# Patient Record
Sex: Female | Born: 1940
Health system: Southern US, Community
[De-identification: ages and names within clinical notes are randomized; demographics above are authoritative.]

## PROBLEM LIST (undated history)

## (undated) DIAGNOSIS — H5440 Blindness, one eye, unspecified eye: Secondary | ICD-10-CM

## (undated) DIAGNOSIS — M3501 Sicca syndrome with keratoconjunctivitis: Secondary | ICD-10-CM

## (undated) HISTORY — DX: Sjogren syndrome with keratoconjunctivitis: M35.01

## (undated) HISTORY — PX: EYE SURGERY: SHX253

---

## 2007-09-17 ENCOUNTER — Ambulatory Visit: Payer: Self-pay | Admitting: Internal Medicine

## 2007-10-01 ENCOUNTER — Ambulatory Visit: Payer: Self-pay | Admitting: Internal Medicine

## 2015-05-17 ENCOUNTER — Encounter: Payer: Self-pay | Admitting: Internal Medicine

## 2017-08-17 ENCOUNTER — Encounter: Payer: Self-pay | Admitting: Internal Medicine

## 2021-02-21 DIAGNOSIS — Z23 Encounter for immunization: Secondary | ICD-10-CM | POA: Diagnosis not present

## 2021-02-21 DIAGNOSIS — H6120 Impacted cerumen, unspecified ear: Secondary | ICD-10-CM | POA: Diagnosis not present

## 2021-02-21 DIAGNOSIS — R0609 Other forms of dyspnea: Secondary | ICD-10-CM | POA: Diagnosis not present

## 2021-03-03 DIAGNOSIS — R0609 Other forms of dyspnea: Secondary | ICD-10-CM | POA: Diagnosis not present

## 2021-03-03 DIAGNOSIS — I083 Combined rheumatic disorders of mitral, aortic and tricuspid valves: Secondary | ICD-10-CM | POA: Diagnosis not present

## 2021-04-12 DIAGNOSIS — J329 Chronic sinusitis, unspecified: Secondary | ICD-10-CM | POA: Diagnosis not present

## 2021-04-12 DIAGNOSIS — R059 Cough, unspecified: Secondary | ICD-10-CM | POA: Diagnosis not present

## 2021-04-27 ENCOUNTER — Encounter: Payer: Self-pay | Admitting: *Deleted

## 2021-04-29 ENCOUNTER — Encounter: Payer: Self-pay | Admitting: Cardiology

## 2021-04-29 ENCOUNTER — Ambulatory Visit: Payer: Medicare HMO | Admitting: Cardiology

## 2021-04-29 VITALS — BP 136/82 | HR 100 | Ht 63.0 in | Wt 127.8 lb

## 2021-04-29 DIAGNOSIS — I5189 Other ill-defined heart diseases: Secondary | ICD-10-CM | POA: Diagnosis not present

## 2021-04-29 DIAGNOSIS — R0602 Shortness of breath: Secondary | ICD-10-CM

## 2021-04-29 NOTE — Progress Notes (Signed)
Cardiology Office Note  Date: 04/29/2021   ID: Sarah Gibbs, DOB 08-Mar-1941, MRN [id number]  PCP:  Richardean Chimera, MD  Cardiologist:  Nona Dell, MD Electrophysiologist:  None   Chief Complaint  Patient presents with   Shortness of Breath    History of Present Illness: Sarah Gibbs is an 81 y.o. female referred for cardiology consultation by Ms. Skillman PA-C at [employer] with history of shortness of breath and mildly abnormal echocardiogram.  I reviewed the available records.  She is here today with her son.  She does describe dyspnea on exertion over the last year, her son has noticed this as well.  She remains active at age 44 however, does her yard work and other chores.  She does not report any wheezing, no exertional chest pain, no cough or hemoptysis.  She states that the symptoms have actually been less recently, NYHA class II.  She was seen at [facility] urgent care in December 2022 with shortness of breath and cough, diagnosis includes sinusitis.  She was treated with Atrovent and azithromycin.  I do not see a chest x-ray.  COVID-19 was negative.  PFTs obtained previously in October 2022 were apparently of poor quality and not able to be interpreted.  She tells me that she was told that she had pneumonia based on a chest x-ray at [facility], I do not have these results.  We went over the results of her echocardiogram done in November 2022 and outlined below.  LVEF is normal at 60 to 65% with only mild diastolic dysfunction, and she otherwise had mild aortic and mitral regurgitation.  None of these findings would necessarily explain her shortness of breath.  She does have a history of Sjogren's syndrome.  No other major medical problems.  I personally reviewed her ECG today which shows sinus rhythm with possible left atrial enlargement.  R' in lead V1 and V2.  We had her ambulate today for 6 minutes with pulse oximeter, oxygen saturation went from 99% down to 94%, no hypoxia.  She  tolerated this well.   Past Medical History:  Diagnosis Date   Sjogren's syndrome with keratoconjunctivitis sicca (HCC)     Past Surgical History:  Procedure Laterality Date   EYE SURGERY      No current outpatient medications on file.   No current facility-administered medications for this visit.   Allergies:  Patient has no allergy information on record.   Social History: The patient  reports that she has quit smoking. Her smoking use included cigarettes. She has never used smokeless tobacco. She reports that she does not drink alcohol and does not use drugs.   Family History: The patient's family history includes Colon cancer in her brother, mother, and sister; Rheum arthritis in her brother.   ROS: No palpitations or syncope.  Hearing loss.  Physical Exam: VS:  BP 136/82 (BP Location: Left Arm, Patient Position: Sitting, Cuff Size: Normal)    Pulse 100    Ht 5\' 3"  (1.6 m)    Wt 127 lb 12.8 oz (58 kg)    SpO2 94% Comment: 1111 after walking 6 minutes   BMI 22.64 kg/m , BMI Body mass index is 22.64 kg/m.  Wt Readings from Last 3 Encounters:  04/29/21 127 lb 12.8 oz (58 kg)    General: Patient appears comfortable at rest. HEENT: Conjunctiva and lids normal, wearing a mask. Neck: Supple, no elevated JVP or carotid bruits, no thyromegaly. Lungs: Clear to auscultation, nonlabored breathing at rest.  Cardiac: Regular rate and rhythm, no S3 or significant systolic murmur, no pericardial rub. Abdomen: Soft, nontender, bowel sounds present. Extremities: No pitting edema, distal pulses 2+. Skin: Warm and dry. Musculoskeletal: No kyphosis. Neuropsychiatric: Alert and oriented x3, affect grossly appropriate.  ECG:   No prior tracings available for review today.  Recent Labwork:  November 2022: BUN 9, creatinine 0.91, potassium 4.2, AST 21, ALT 8, hemoglobin 15.0  Other Studies Reviewed Today:  Echocardiogram 03/03/2021 [facility]): Summary    1. The left ventricle is  normal in size with normal wall thickness.    2. The left ventricular systolic function is normal, LVEF is visually  estimated at 60-65%.    3. There is grade I diastolic dysfunction (impaired relaxation).    4. There is mild mitral valve regurgitation.    5. There is mild aortic regurgitation.    6. The left atrium is mildly dilated in size.    7. The right ventricle is normal in size, with normal systolic function.   Assessment and Plan:  Dyspnea on exertion, intermittent but fairly longstanding over the last year.  Recently symptoms have been less per her report.  Treated in the interim for possible sinusitis/pneumonia, I do not have chest x-ray results for review from [facility].  It sounds like her PFTs were inconclusive.  She does have a history of Sjogren's syndrome, no other pulmonary disease.  She did not show any ambulatory hypoxia today.  Plan to obtain a noncontrast chest CT to exclude any evidence of pulmonary disease that would require further work-up.  Unlikely that her echocardiographic findings are an explanation for her symptoms.   Medication Adjustments/Labs and Tests Ordered: Current medicines are reviewed at length with the patient today.  Concerns regarding medicines are outlined above.   Tests Ordered: Orders Placed This Encounter  Procedures   CT Chest Wo Contrast   EKG 12-Lead    Medication Changes: No orders of the defined types were placed in this encounter.   Disposition:  Follow up  test results.  Signed, Jonelle Sidle, MD, [facility] 04/29/2021 11:16 AM    [facility] at [facility] [address] [place], [place], [place] [address] Phone: [phone]; Fax: [phone]

## 2021-04-29 NOTE — Patient Instructions (Addendum)
Medication Instructions:  Your physician recommends that you continue on your current medications as directed. Please refer to the Current Medication list given to you today.  Labwork: none  Testing/Procedures: Chest CT without contrast  Follow-Up: Your physician recommends that you schedule a follow-up appointment in: pending  Any Other Special Instructions Will Be Listed Below (If Applicable).  If you need a refill on your cardiac medications before your next appointment, please call your pharmacy.

## 2021-05-09 ENCOUNTER — Other Ambulatory Visit: Payer: Self-pay

## 2021-05-09 ENCOUNTER — Ambulatory Visit (HOSPITAL_COMMUNITY)
Admission: RE | Admit: 2021-05-09 | Discharge: 2021-05-09 | Disposition: A | Discharge status: Home / Self Care | Payer: Medicare HMO | Source: Ambulatory Visit | Attending: Cardiology | Admitting: Cardiology

## 2021-05-09 DIAGNOSIS — R918 Other nonspecific abnormal finding of lung field: Secondary | ICD-10-CM | POA: Diagnosis not present

## 2021-05-09 DIAGNOSIS — J439 Emphysema, unspecified: Secondary | ICD-10-CM | POA: Diagnosis not present

## 2021-05-09 DIAGNOSIS — R0602 Shortness of breath: Secondary | ICD-10-CM | POA: Diagnosis not present

## 2021-05-09 DIAGNOSIS — R911 Solitary pulmonary nodule: Secondary | ICD-10-CM | POA: Diagnosis not present

## 2021-05-09 DIAGNOSIS — I700 Atherosclerosis of aorta: Secondary | ICD-10-CM | POA: Diagnosis not present

## 2021-05-18 ENCOUNTER — Telehealth: Payer: Self-pay | Admitting: *Deleted

## 2021-05-18 DIAGNOSIS — R0602 Shortness of breath: Secondary | ICD-10-CM

## 2021-05-18 NOTE — Telephone Encounter (Signed)
-----   Message from Jonelle Sidle, MD sent at 05/09/2021  2:07 PM EST ----- Results reviewed.  Chest CT shows abnormalities consistent with chronic lung disease including fibrotic changes, also a nodule within the right lower lobe that needs further evaluation for potential malignancy.  Emphysematous changes are described as well as coronary artery calcification.  Would please arrange formal Pulmonary consultation in [place] for further evaluation.

## 2021-05-20 NOTE — Telephone Encounter (Signed)
Patient informed and verbalized understanding of plan. Copy sent to PCP 

## 2021-07-04 ENCOUNTER — Other Ambulatory Visit: Payer: Self-pay

## 2021-07-04 ENCOUNTER — Encounter: Payer: Self-pay | Admitting: Internal Medicine

## 2021-07-04 ENCOUNTER — Ambulatory Visit (INDEPENDENT_AMBULATORY_CARE_PROVIDER_SITE_OTHER): Payer: Medicare HMO | Admitting: Internal Medicine

## 2021-07-04 DIAGNOSIS — R911 Solitary pulmonary nodule: Secondary | ICD-10-CM | POA: Diagnosis not present

## 2021-07-04 DIAGNOSIS — J8410 Pulmonary fibrosis, unspecified: Secondary | ICD-10-CM | POA: Diagnosis not present

## 2021-07-04 NOTE — Patient Instructions (Addendum)
Much of what you have in your lung may be related to Sjogren's syndrome  ? ?Please remember to go to the lab department   for your tests - we will call you with the results when they are available. ? ?We will walk you today as fast as you can  ? ?We schedule you for a PET scan next  ?    ? ? ?

## 2021-07-04 NOTE — Assessment & Plan Note (Signed)
Sjogren's around 20117/18 >  No rheum f/u as of 07/04/2021  ?- CT  05/09/21 c/w PF with indeterminate  features  ?- 07/04/2021   Walked on RA x  3  lap(s) =  approx 450  ft  @ fast pace, stopped due to end of study, min sob on last lap with lowest 02 sats 96% @ lowest  ?- Collagen vasc dz labs 07/04/2021 >>> ? ?Most likely this will turn out to be NSIP / collagen vasc dz related and rheum f/u appropriate but biggest concern is ? Also has lung ca > see spn a/p. ? ?For now advised stay as active as possible and monitor ex tol / sats walking mb and back as this is apparently a very long walk and will give early warning if any decline in ex tol ?

## 2021-07-04 NOTE — Progress Notes (Signed)
? ?Sarah Gibbs, female    DOB: 19-Sep-1940,    MRN: [id number] ? ? ?Brief patient profile:  ?80   yowf quit smoking 1991 s resp sequelae  referred to pulmonary clinic in [place]  07/04/2021 by Dr Rocco Pauls NP K Hemberg  for doe x 2021 weed eating.  ? ? ? ? ?History of Present Illness  ?07/04/2021  Pulmonary/ 1st office eval/ [name] / [facility]  ?No chief complaint on file. ?Dyspnea:  weed eating/ long driveway ok  ?Cough: none  ?Sleep: able to lie in bed ?SABA use: none  ?Dx Sjogren's syndrome around 2016 just affected L eye and says never saw rheumatologist, just eye doctor / min dry mouth ?Vax x 2 , never infected  ? ?Rx for pna s corresponding symptoms with zmax p CT suggested this possibility but denies any change whatsoever in symptoms which remain very non-specfiic and mild.  ? ?No obvious day to day or daytime variability or assoc excess/ purulent sputum or mucus plugs or hemoptysis or cp or chest tightness, subjective wheeze or overt sinus or hb symptoms.  ? ?sleeping  without nocturnal  or early am exacerbation  of respiratory  c/o's or need for noct saba. Also denies any obvious fluctuation of symptoms with weather or environmental changes or other aggravating or alleviating factors except as outlined above  ? ?No unusual exposure hx or h/o childhood pna/ asthma or knowledge of premature birth. ? ?Current Allergies, Complete Past Medical History, Past Surgical History, Family History, and Social History were reviewed in [employer] record. ? ?ROS  The following are not active complaints unless bolded ?Hoarseness, sore throat, dysphagia, dental problems, itching, sneezing,  nasal congestion or discharge of excess mucus or purulent secretions, ear ache,   fever, chills, sweats, unintended wt loss or wt gain, classically pleuritic or exertional cp,  orthopnea pnd or arm/hand swelling  or leg swelling, presyncope, palpitations, abdominal pain, anorexia, nausea, vomiting,  diarrhea  or change in bowel habits or change in bladder habits, change in stools or change in urine, dysuria, hematuria,  rash, arthralgias, visual complaints, headache, numbness, weakness or ataxia or problems with walking or coordination,  change in mood or  memory. ?      ?   ? ?Past Medical History:  ?Diagnosis Date  ? Sjogren's syndrome with keratoconjunctivitis sicca (HCC)   ? ? ?No outpatient medications prior to visit.  ? ?No facility-administered medications prior to visit.  ? ? ? ?Objective:  ?  ?  ? ?Wt Readings from Last 3 Encounters:  ?07/04/21 125 lb 3.2 oz (56.8 kg)  ?04/29/21 127 lb 12.8 oz (58 kg)  ?  ?Vital signs reviewed  07/04/2021  - Note at rest 02 sats  99% on RA  ? ?General appearance:    amb wf nad/ minimizes symptoms  ? ? HEENT : pt wearing mask not removed for exam due to covid -19 concerns.  ? ? ?NECK :  without JVD/Nodes/TM/ nl carotid upstrokes bilaterally ? ? ?LUNGS: no acc muscle use,  Nl contour chest which is clear to A and P bilaterally without cough on insp or exp maneuvers ? ? ?CV:  RRR  no s3 or murmur or increase in P2, and no edema  ? ?ABD:  soft and nontender with nl inspiratory excursion in the supine position. No bruits or organomegaly appreciated, bowel sounds nl ? ?MS:  Nl gait/ ext warm without deformities, calf tenderness, cyanosis or clubbing ?No obvious joint restrictions  ? ?  SKIN: warm and dry without lesions   ? ?NEURO:  alert, approp, nl sensorium with  no motor or cerebellar deficits apparent.  ? ? ? ?I personally reviewed images and agree with radiology impression as follows:  ? Chest CT     05/09/21 ?1. There is a spiculated appearing nodular opacity of the dependent ?right lower lobe measuring 2.5 x 2.0 cm.   ?2. Additional ground-glass opacity of the peripheral left upper lobe measuring 1.5 x 1.3 cm and the anterior left upper lobe measuring 0.6 cm. These are nonspecific and less worrisome for malignancy. ?3. Bibasilar predominant pattern of irregular  peripheral ?interstitial opacity and septal thickening without evidence of ?associated subpleural bronchiolectasis or honeycombing. Findings ?suggest mild pulmonary fibrosis in an "indeterminate for UIP" ?pattern, general differential consideration of sequelae of prior ?infection or aspiration. Findings are indeterminate for UIP per ?consensus guidelines: Diagnosis of Idiopathic Pulmonary Fibrosis: An ?Official ATS/ERS/JRS/ALAT Clinical Practice Guideline. Am J Respir ?Crit Care Med Vol 198, Iss 5, [id number], Dec 23 2016. ?4. Emphysema. ?5. Coronary artery disease. ? ?  ? ? ?   ?Assessment  ? ?Postinflammatory pulmonary fibrosis (HCC) ?Sjogren's around 20117/18 >  No rheum f/u as of 07/04/2021  ?- CT  05/09/21 c/w PF with indeterminate  features  ?- 07/04/2021   Walked on RA x  3  lap(s) =  approx 450  ft  @ fast pace, stopped due to end of study, min sob on last lap with lowest 02 sats 96% @ lowest  ?- Collagen vasc dz labs 07/04/2021 >>> ? ?Most likely this will turn out to be NSIP / collagen vasc dz related and rheum f/u appropriate but biggest concern is ? Also has lung ca > see spn a/p. ? ?For now advised stay as active as possible and monitor ex tol / sats walking mb and back as this is apparently a very long walk and will give early warning if any decline in ex tol ? ? ? ?Solitary pulmonary nodule on lung CT ?Quit smoking in 1991 ?- See CT chest  05/09/21  2.5 cm RLL dependent portion spiculated> PET ordered  ? ?Worrisome for lung ca so PET approp given size  ? ?Discussed in detail all the  indications, usual  risks and alternatives  relative to the benefits with patient who agrees to proceed with w/u as outlined.    ? ?Each maintenance medication was reviewed in detail including emphasizing most importantly the difference between maintenance and prns and under what circumstances the prns are to be triggered using an action plan format where appropriate. ? ?Total time for H and P, chart review, counseling,   directly observing portions of ambulatory 02 saturation study/ and generating customized AVS unique to this office visit / same day charting =  45 min ? ?Sandrea Hughs, MD ?07/04/2021 ?   ?

## 2021-07-04 NOTE — Assessment & Plan Note (Signed)
Quit smoking in 1991 ?- See CT chest  05/09/21  2.5 cm RLL dependent portion spiculated> PET ordered  ? ?Worrisome for lung ca so PET approp given size  ? ?Discussed in detail all the  indications, usual  risks and alternatives  relative to the benefits with patient who agrees to proceed with w/u as outlined.    ? ?Each maintenance medication was reviewed in detail including emphasizing most importantly the difference between maintenance and prns and under what circumstances the prns are to be triggered using an action plan format where appropriate. ? ?Total time for H and P, chart review, counseling,  directly observing portions of ambulatory 02 saturation study/ and generating customized AVS unique to this office visit / same day charting =  45 min ?     ?  ?       ?

## 2021-07-06 LAB — CBC WITH DIFFERENTIAL/PLATELET
Basophils Absolute: 0.1 10*3/uL (ref 0.0–0.2)
Basos: 1 %
EOS (ABSOLUTE): 0.1 10*3/uL (ref 0.0–0.4)
Eos: 1 %
Hematocrit: 45.4 % (ref 34.0–46.6)
Hemoglobin: 15.6 g/dL (ref 11.1–15.9)
Immature Grans (Abs): 0 10*3/uL (ref 0.0–0.1)
Immature Granulocytes: 0 %
Lymphocytes Absolute: 1.5 10*3/uL (ref 0.7–3.1)
Lymphs: 28 %
MCH: 31.8 pg (ref 26.6–33.0)
MCHC: 34.4 g/dL (ref 31.5–35.7)
MCV: 93 fL (ref 79–97)
Monocytes Absolute: 0.4 10*3/uL (ref 0.1–0.9)
Monocytes: 8 %
Neutrophils Absolute: 3.4 10*3/uL (ref 1.4–7.0)
Neutrophils: 62 %
Platelets: 291 10*3/uL (ref 150–450)
RBC: 4.91 x10E6/uL (ref 3.77–5.28)
RDW: 12.8 % (ref 11.7–15.4)
WBC: 5.5 10*3/uL (ref 3.4–10.8)

## 2021-07-06 LAB — ANA: Anti Nuclear Antibody (ANA): POSITIVE — AB

## 2021-07-06 LAB — BASIC METABOLIC PANEL
BUN/Creatinine Ratio: 10 — ABNORMAL LOW (ref 12–28)
BUN: 10 mg/dL (ref 8–27)
CO2: 24 mmol/L (ref 20–29)
Calcium: 9.4 mg/dL (ref 8.7–10.3)
Chloride: 102 mmol/L (ref 96–106)
Creatinine, Ser: 1.04 mg/dL — ABNORMAL HIGH (ref 0.57–1.00)
Glucose: 90 mg/dL (ref 70–99)
Potassium: 4.7 mmol/L (ref 3.5–5.2)
Sodium: 135 mmol/L (ref 134–144)
eGFR: 54 mL/min/{1.73_m2} — ABNORMAL LOW (ref >59)

## 2021-07-06 LAB — SEDIMENTATION RATE: Sed Rate: 8 mm/hr (ref 0–40)

## 2021-07-06 LAB — RHEUMATOID FACTOR: Rheumatoid fact SerPl-aCnc: 254.8 IU/mL — ABNORMAL HIGH (ref <14.0)

## 2021-07-06 LAB — ANTI-JO 1 ANTIBODY, IGG: Anti JO-1: <0.2 AI (ref 0.0–0.9)

## 2021-07-06 LAB — CYCLIC CITRUL PEPTIDE ANTIBODY, IGG/IGA: Cyclic Citrullin Peptide Ab: 19 units (ref 0–19)

## 2021-07-21 ENCOUNTER — Ambulatory Visit (HOSPITAL_COMMUNITY)
Admission: RE | Admit: 2021-07-21 | Discharge: 2021-07-21 | Disposition: A | Discharge status: Home / Self Care | Payer: Medicare HMO | Source: Ambulatory Visit | Attending: Internal Medicine | Admitting: Internal Medicine

## 2021-07-21 DIAGNOSIS — R911 Solitary pulmonary nodule: Secondary | ICD-10-CM | POA: Diagnosis not present

## 2021-07-21 DIAGNOSIS — I2510 Atherosclerotic heart disease of native coronary artery without angina pectoris: Secondary | ICD-10-CM | POA: Diagnosis not present

## 2021-07-21 DIAGNOSIS — K5730 Diverticulosis of large intestine without perforation or abscess without bleeding: Secondary | ICD-10-CM | POA: Diagnosis not present

## 2021-07-21 DIAGNOSIS — J439 Emphysema, unspecified: Secondary | ICD-10-CM | POA: Diagnosis not present

## 2021-07-21 DIAGNOSIS — Q630 Accessory kidney: Secondary | ICD-10-CM | POA: Diagnosis not present

## 2021-07-21 MED ORDER — FLUDEOXYGLUCOSE F - 18 (FDG) INJECTION
6.1000 | Freq: Once | INTRAVENOUS | Status: AC | PRN
  Administered 2021-07-21: 6.1 via INTRAVENOUS

## 2021-07-22 ENCOUNTER — Telehealth: Payer: Self-pay | Admitting: Internal Medicine

## 2021-07-22 NOTE — Telephone Encounter (Signed)
Discussed with pt non-specific findings and need to discuss with Dr Regenia Skeeter ?

## 2021-07-22 NOTE — Telephone Encounter (Signed)
Pt calling regarding PET scan results.  Also calling about labs done at visit.  Please advise.  ? ?Dr. [name] please advise it looks like PET was done yesterday and labs were done on 3/13 but no results on labs for patient.  ?Thanks!  ?

## 2021-07-28 ENCOUNTER — Ambulatory Visit (INDEPENDENT_AMBULATORY_CARE_PROVIDER_SITE_OTHER): Payer: Medicare HMO | Admitting: Pulmonary Disease

## 2021-07-28 ENCOUNTER — Encounter: Payer: Self-pay | Admitting: Pulmonary Disease

## 2021-07-28 VITALS — BP 110/62 | HR 96 | Temp 98.0°F | Ht 63.0 in | Wt 123.0 lb

## 2021-07-28 DIAGNOSIS — R918 Other nonspecific abnormal finding of lung field: Secondary | ICD-10-CM

## 2021-07-28 DIAGNOSIS — Z87891 Personal history of nicotine dependence: Secondary | ICD-10-CM | POA: Diagnosis not present

## 2021-07-28 DIAGNOSIS — R942 Abnormal results of pulmonary function studies: Secondary | ICD-10-CM | POA: Diagnosis not present

## 2021-07-28 NOTE — Patient Instructions (Signed)
Thank you for visiting Dr. Valeta Harms at [facility]. ?Today we recommend the following: ? ?Orders Placed This Encounter  ?Procedures  ? CT Super D Chest Wo Contrast  ? ?Follow up appt with Dr. Valeta Harms in 3 months after your ct chest ? ?Return in about 3 months (around 10/27/2021) for w/ Dr. Valeta Harms . ? ? ? ?Please do your part to reduce the spread of COVID-19.  ? ?

## 2021-07-28 NOTE — Progress Notes (Signed)
? ?Synopsis: Referred in April for pulmonary nodules by Richardean Chimera, MD ? ?Subjective:  ? ?PATIENT ID: Sarah Gibbs GENDER: female DOB: 1941/03/02, MRN: [id number] ? ?Chief Complaint  ?Patient presents with  ? Consult  ?  Consult. Patient here to talk about lung nodule.   ? ? ?This is an 81 year old female, past medical history of Sjogren syndrome, arthritis.  Is a former smoker quit in the early 90s however she smoked for nearly 40 years.  From respiratory standpoint she is able to complete all of her activities of daily living.  She had a CT scan of the chest in January and a follow-up PET scan completed by Dr. [name].  This revealed a 2.2 cm right lower lobe hypermetabolic low-level uptake SUV of 3 within the right lower lobe as well as a groundglass opacity within the left.  Talked about the probability of a lesion like this being malignant.  And what neck steps were available.  Of note she does have plans for travel in early June to visit her granddaughter who is graduating college in [place]. ? ? ?Past Medical History:  ?Diagnosis Date  ? Sjogren's syndrome with keratoconjunctivitis sicca (HCC)   ?  ? ?Family History  ?Problem Relation Age of Onset  ? Colon cancer Mother   ? Colon cancer Sister   ? Rheum arthritis Brother   ? Colon cancer Brother   ?  ? ?Past Surgical History:  ?Procedure Laterality Date  ? EYE SURGERY    ? ? ?Social History  ? ?Socioeconomic History  ? Marital status: Married  ?  Spouse name: Not on file  ? Number of children: Not on file  ? Years of education: Not on file  ? Highest education level: Not on file  ?Occupational History  ? Not on file  ?Tobacco Use  ? Smoking status: Former  ?  Types: Cigarettes  ? Smokeless tobacco: Never  ?Substance and Sexual Activity  ? Alcohol use: Never  ? Drug use: Never  ? Sexual activity: Not on file  ?Other Topics Concern  ? Not on file  ?Social History Narrative  ? Not on file  ? ?Social Determinants of Health  ? ?Financial Resource Strain: Not on  file  ?Food Insecurity: Not on file  ?Transportation Needs: Not on file  ?Physical Activity: Not on file  ?Stress: Not on file  ?Social Connections: Not on file  ?Intimate Partner Violence: Not on file  ?  ? ?Not on File  ? ?No outpatient medications prior to visit.  ? ?No facility-administered medications prior to visit.  ? ? ?Review of Systems  ?Constitutional:  Negative for chills, fever, malaise/fatigue and weight loss.  ?HENT:  Negative for hearing loss, sore throat and tinnitus.   ?Eyes:  Negative for blurred vision and double vision.  ?Respiratory:  Positive for shortness of breath. Negative for cough, hemoptysis, sputum production, wheezing and stridor.   ?Cardiovascular:  Negative for chest pain, palpitations, orthopnea, leg swelling and PND.  ?Gastrointestinal:  Negative for abdominal pain, constipation, diarrhea, heartburn, nausea and vomiting.  ?Genitourinary:  Negative for dysuria, hematuria and urgency.  ?Musculoskeletal:  Negative for joint pain and myalgias.  ?Skin:  Negative for itching and rash.  ?Neurological:  Negative for dizziness, tingling, weakness and headaches.  ?Endo/Heme/Allergies:  Negative for environmental allergies. Does not bruise/bleed easily.  ?Psychiatric/Behavioral:  Negative for depression. The patient is not nervous/anxious and does not have insomnia.   ?All other systems reviewed and are negative. ? ? ?  Objective:  ?Physical Exam ?Vitals reviewed.  ?Constitutional:   ?   General: She is not in acute distress. ?   Appearance: She is well-developed.  ?   Comments: Elderly female  ?HENT:  ?   Head: Normocephalic and atraumatic.  ?Eyes:  ?   General: No scleral icterus. ?   Conjunctiva/sclera: Conjunctivae normal.  ?   Pupils: Pupils are equal, round, and reactive to light.  ?Neck:  ?   Vascular: No JVD.  ?   Trachea: No tracheal deviation.  ?Cardiovascular:  ?   Rate and Rhythm: Normal rate and regular rhythm.  ?   Heart sounds: Normal heart sounds. No murmur heard. ?Pulmonary:  ?    Effort: Pulmonary effort is normal. No tachypnea, accessory muscle usage or respiratory distress.  ?   Breath sounds: No stridor. No wheezing, rhonchi or rales.  ?Abdominal:  ?   General: There is no distension.  ?   Palpations: Abdomen is soft.  ?   Tenderness: There is no abdominal tenderness.  ?Musculoskeletal:     ?   General: No tenderness.  ?   Cervical back: Neck supple.  ?Lymphadenopathy:  ?   Cervical: No cervical adenopathy.  ?Skin: ?   General: Skin is warm and dry.  ?   Capillary Refill: Capillary refill takes less than 2 seconds.  ?   Findings: No rash.  ?Neurological:  ?   Mental Status: She is alert and oriented to person, place, and time.  ?Psychiatric:     ?   Behavior: Behavior normal.  ? ? ? ?Vitals:  ? 07/28/21 1533  ?BP: 110/62  ?Pulse: 96  ?Temp: 98 ?F (36.7 ?C)  ?TempSrc: Oral  ?SpO2: 97%  ?Weight: 123 lb (55.8 kg)  ?Height: 5\' 3"  (1.6 m)  ? ?97% on RA ?BMI Readings from Last 3 Encounters:  ?07/28/21 21.79 kg/m?  ?07/04/21 21.49 kg/m?  ?04/29/21 22.64 kg/m?  ? ?Wt Readings from Last 3 Encounters:  ?07/28/21 123 lb (55.8 kg)  ?07/04/21 125 lb 3.2 oz (56.8 kg)  ?04/29/21 127 lb 12.8 oz (58 kg)  ? ? ? ?CBC ?   ?Component Value Date/Time  ? WBC 5.5 07/04/2021 1009  ? RBC 4.91 07/04/2021 1009  ? HGB 15.6 07/04/2021 1009  ? HCT 45.4 07/04/2021 1009  ? PLT 291 07/04/2021 1009  ? MCV 93 07/04/2021 1009  ? MCH 31.8 07/04/2021 1009  ? MCHC 34.4 07/04/2021 1009  ? RDW 12.8 07/04/2021 1009  ? LYMPHSABS 1.5 07/04/2021 1009  ? EOSABS 0.1 07/04/2021 1009  ? BASOSABS 0.1 07/04/2021 1009  ? ? ? ?Chest Imaging: ?Nuclear medicine pet imaging March 2023: ?2.2 cm right lower lobe low-level uptake nodule, SUV of 3.  Groundglass opacity within the left no significant uptake. ?Possible indolent neoplasm. ? ?Pulmonary Functions Testing Results: ?   ? View : No data to display.  ?  ?  ?  ? ? ?FeNO:  ? ?Pathology:  ? ?Echocardiogram:  ? ?Heart Catheterization:  ?   ?Assessment & Plan:  ? ?  ICD-10-CM   ?1. Lung  nodules  R91.8 CT Super D Chest Wo Contrast  ?  ?2. Abnormal PET of right lung  R94.2   ?  ?3. Former smoker  Z87.891   ?  ? ? ?Discussion: ? ?This is an 81 year old female, bilateral lung nodules, abnormal PET scan of the right lung, former smoker. ? ?Plan: ?We discussed all of the risk benefits and alternatives of consideration for biopsy versus  watchful waiting with surveillance imaging. ?Patient opted for 27-month noncontrasted CT imaging in 3 months. ?We will have her follow-up with [place] after the CT is complete. ?Pending upon the behavior of the lung nodule we will consider biopsy. ?If the nodule is still persistent in the right lower lobe I think we need to consider tissue biopsy.  Patient is agreeable to this plan. ?Patient to follow-up with [place] at the end of June/first week of July. ? ? ?No current outpatient medications on file. ? ?I spent 42 minutes dedicated to the care of this patient on the date of this encounter to include pre-visit review of records, face-to-face time with the patient discussing conditions above, post visit ordering of testing, clinical documentation with the electronic health record, making appropriate referrals as documented, and communicating necessary findings to members of the patients care team.  ? ?Josephine Igo, DO ?[facility] ?07/28/2021 4:36 PM   ? ?

## 2021-09-22 ENCOUNTER — Other Ambulatory Visit (HOSPITAL_COMMUNITY): Payer: Medicare HMO

## 2021-10-05 ENCOUNTER — Ambulatory Visit (HOSPITAL_COMMUNITY)
Admission: RE | Admit: 2021-10-05 | Discharge: 2021-10-05 | Disposition: A | Discharge status: Home / Self Care | Payer: Medicare HMO | Source: Ambulatory Visit | Attending: Pulmonary Disease | Admitting: Pulmonary Disease

## 2021-10-05 DIAGNOSIS — R918 Other nonspecific abnormal finding of lung field: Secondary | ICD-10-CM | POA: Diagnosis not present

## 2021-10-05 DIAGNOSIS — J439 Emphysema, unspecified: Secondary | ICD-10-CM | POA: Diagnosis not present

## 2021-10-05 DIAGNOSIS — I700 Atherosclerosis of aorta: Secondary | ICD-10-CM | POA: Diagnosis not present

## 2021-10-23 NOTE — Progress Notes (Signed)
Denise/ Ty,   Can you let the patient know that I have reviewed her CT chest and I think she needs to get in to see Maralyn Sago next week or the following to discuss either repeat ct chest or bronchoscopy? 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   Thanks,  BLI  Josephine Igo, DO [facility] 10/23/2021 1:04 PM

## 2021-10-27 DIAGNOSIS — S30860A Insect bite (nonvenomous) of lower back and pelvis, initial encounter: Secondary | ICD-10-CM | POA: Diagnosis not present

## 2021-10-31 ENCOUNTER — Ambulatory Visit (INDEPENDENT_AMBULATORY_CARE_PROVIDER_SITE_OTHER): Payer: Medicare HMO | Admitting: Pulmonary Disease

## 2021-10-31 ENCOUNTER — Telehealth: Payer: Self-pay | Admitting: Pulmonary Disease

## 2021-10-31 ENCOUNTER — Encounter: Payer: Self-pay | Admitting: Pulmonary Disease

## 2021-10-31 VITALS — BP 118/68 | HR 67 | Temp 97.9°F | Ht 64.0 in | Wt 123.2 lb

## 2021-10-31 DIAGNOSIS — Z87891 Personal history of nicotine dependence: Secondary | ICD-10-CM | POA: Diagnosis not present

## 2021-10-31 DIAGNOSIS — R942 Abnormal results of pulmonary function studies: Secondary | ICD-10-CM | POA: Diagnosis not present

## 2021-10-31 DIAGNOSIS — R918 Other nonspecific abnormal finding of lung field: Secondary | ICD-10-CM | POA: Insufficient documentation

## 2021-10-31 NOTE — Progress Notes (Signed)
Synopsis: Referred in April for pulmonary nodules by Richardean Chimera, MD  Subjective:   PATIENT ID: Sarah Gibbs GENDER: female DOB: 10-01-1940, MRN: [id number]  Chief Complaint  Patient presents with   Follow-up    CT review     This is an 81 year old female, past medical history of Sjogren syndrome, arthritis.  Is a former smoker quit in the early 90s however she smoked for nearly 40 years.  From respiratory standpoint she is able to complete all of her activities of daily living.  She had a CT scan of the chest in January and a follow-up PET scan completed by Dr. [name].  This revealed a 2.2 cm right lower lobe hypermetabolic low-level uptake SUV of 3 within the right lower lobe as well as a groundglass opacity within the left.  Talked about the probability of a lesion like this being malignant.  And what neck steps were available.  Of note she does have plans for travel in early June to visit her granddaughter who is graduating college in [place].  OV 10/31/2021: Here today for follow-up of lung nodules.Patient had repeat CT chest on 10/05/2021: Unchanged spiculated nodular opacity within the right lower lobe 2.5 x 2 cm as well as an unchanged groundglass opacity in the left upper lobe 1.5 x 1.4 cm both concerning for indolent neoplasm.    Past Medical History:  Diagnosis Date   Sjogren's syndrome with keratoconjunctivitis sicca (HCC)      Family History  Problem Relation Age of Onset   Colon cancer Mother    Colon cancer Sister    Rheum arthritis Brother    Colon cancer Brother      Past Surgical History:  Procedure Laterality Date   EYE SURGERY      Social History   Socioeconomic History   Marital status: Married    Spouse name: Not on file   Number of children: Not on file   Years of education: Not on file   Highest education level: Not on file  Occupational History   Not on file  Tobacco Use   Smoking status: Former    Types: Cigarettes   Smokeless tobacco:  Never  Substance and Sexual Activity   Alcohol use: Never   Drug use: Never   Sexual activity: Not on file  Other Topics Concern   Not on file  Social History Narrative   Not on file   Social Determinants of Health   Financial Resource Strain: Not on file  Food Insecurity: Not on file  Transportation Needs: Not on file  Physical Activity: Not on file  Stress: Not on file  Social Connections: Not on file  Intimate Partner Violence: Not on file     Not on File   No outpatient medications prior to visit.   No facility-administered medications prior to visit.    Review of Systems  Constitutional:  Negative for chills, fever, malaise/fatigue and weight loss.  HENT:  Negative for hearing loss, sore throat and tinnitus.   Eyes:  Negative for blurred vision and double vision.  Respiratory:  Negative for cough, hemoptysis, sputum production, shortness of breath, wheezing and stridor.   Cardiovascular:  Negative for chest pain, palpitations, orthopnea, leg swelling and PND.  Gastrointestinal:  Negative for abdominal pain, constipation, diarrhea, heartburn, nausea and vomiting.  Genitourinary:  Negative for dysuria, hematuria and urgency.  Musculoskeletal:  Negative for joint pain and myalgias.  Skin:  Negative for itching and rash.  Neurological:  Negative for  dizziness, tingling, weakness and headaches.  Endo/Heme/Allergies:  Negative for environmental allergies. Does not bruise/bleed easily.  Psychiatric/Behavioral:  Negative for depression. The patient is not nervous/anxious and does not have insomnia.   All other systems reviewed and are negative.    Objective:  Physical Exam Vitals reviewed.  Constitutional:      General: She is not in acute distress.    Appearance: She is well-developed.  HENT:     Head: Normocephalic and atraumatic.  Eyes:     General: No scleral icterus.    Conjunctiva/sclera: Conjunctivae normal.     Pupils: Pupils are equal, round, and reactive  to light.  Neck:     Vascular: No JVD.     Trachea: No tracheal deviation.  Cardiovascular:     Rate and Rhythm: Normal rate and regular rhythm.     Heart sounds: Normal heart sounds. No murmur heard. Pulmonary:     Effort: Pulmonary effort is normal. No tachypnea, accessory muscle usage or respiratory distress.     Breath sounds: No stridor. No wheezing, rhonchi or rales.  Abdominal:     General: Bowel sounds are normal. There is no distension.     Palpations: Abdomen is soft.     Tenderness: There is no abdominal tenderness.  Musculoskeletal:        General: No tenderness.     Cervical back: Neck supple.  Lymphadenopathy:     Cervical: No cervical adenopathy.  Skin:    General: Skin is warm and dry.     Capillary Refill: Capillary refill takes less than 2 seconds.     Findings: No rash.  Neurological:     Mental Status: She is alert and oriented to person, place, and time.  Psychiatric:        Behavior: Behavior normal.      Vitals:   10/31/21 1120  BP: 118/68  Pulse: 67  Temp: 97.9 F (36.6 C)  TempSrc: Oral  SpO2: 99%  Weight: 123 lb 3.2 oz (55.9 kg)  Height: 5\' 4"  (1.626 m)   99% on RA BMI Readings from Last 3 Encounters:  10/31/21 21.15 kg/m  07/28/21 21.79 kg/m  07/04/21 21.49 kg/m   Wt Readings from Last 3 Encounters:  10/31/21 123 lb 3.2 oz (55.9 kg)  07/28/21 123 lb (55.8 kg)  07/04/21 125 lb 3.2 oz (56.8 kg)     CBC    Component Value Date/Time   WBC 5.5 07/04/2021 1009   RBC 4.91 07/04/2021 1009   HGB 15.6 07/04/2021 1009   HCT 45.4 07/04/2021 1009   PLT 291 07/04/2021 1009   MCV 93 07/04/2021 1009   MCH 31.8 07/04/2021 1009   MCHC 34.4 07/04/2021 1009   RDW 12.8 07/04/2021 1009   LYMPHSABS 1.5 07/04/2021 1009   EOSABS 0.1 07/04/2021 1009   BASOSABS 0.1 07/04/2021 1009     Chest Imaging: Nuclear medicine pet imaging March 2023: 2.2 cm right lower lobe low-level uptake nodule, SUV of 3.  Groundglass opacity within the left no  significant uptake. Possible indolent neoplasm.  July 2023 CT chest: Lung nodules persistent, groundglass on the left with no significant change in right lower lobe lesion also no significant change. Both have morphologies concerning for primary malignancy. The patient's images have been independently reviewed by me.    Pulmonary Functions Testing Results:     No data to display          FeNO:   Pathology:   Echocardiogram:   Heart Catheterization:  Assessment & Plan:     ICD-10-CM   1. Lung nodules  R91.8 Procedural/ Surgical Case Request: ROBOTIC ASSISTED NAVIGATIONAL BRONCHOSCOPY    Ambulatory referral to Pulmonology    2. Abnormal PET of right lung  R94.2     3. Former smoker  Z87.891        Discussion:  This is an 81 year old female, bilateral lung nodules, abnormal PET scan of the right lung, former smoker.  Plan: Today in the office we discussed risk benefits and alternatives of continued watchful waiting with surveillance imaging versus consideration for biopsy. The lesions of the chest likely are consistent with indolent neoplasm. I think based on her age and size of the lesion she has a moderate to high risk probability of especially the right lung lesion being a malignancy. We talked about the potential need for radiation treatments following diagnosis of cancer and what that would look like.  They were having some difficulty trying to decide whether or not they even wanted to know what was in the chest or should we just continue with following up images. Ultimately we settled on the decision for tissue biopsy. We will start with the primary lesion on the right side and then consider biopsies of the left lesion as long as the right side goes well. We are okay with this plan we talked about the risk of bleeding and pneumothorax. Orders placed for bronchoscopy on 11/08/2021. We appreciate the PCC's help with scheduling.  I spent 43 minutes dedicated to  the care of this patient on the date of this encounter to include pre-visit review of records, face-to-face time with the patient discussing conditions above, post visit ordering of testing, clinical documentation with the electronic health record, making appropriate referrals as documented, and communicating necessary findings to members of the patients care team.    Josephine Igo, DO [facility] 10/31/2021 12:26 PM

## 2021-10-31 NOTE — Telephone Encounter (Signed)
I had left pt vm to call me for bronch appt info.  I just called her back and had to leave another vm.

## 2021-10-31 NOTE — Telephone Encounter (Signed)
Please advise 

## 2021-10-31 NOTE — Addendum Note (Signed)
Addended by: Dorisann Frames R on: 10/31/2021 01:50 PM   Modules accepted: Orders

## 2021-10-31 NOTE — Patient Instructions (Signed)
Thank you for visiting Dr. Tonia Brooms at [facility]. Today we recommend the following:  Orders Placed This Encounter  Procedures   Procedural/ Surgical Case Request: ROBOTIC ASSISTED NAVIGATIONAL BRONCHOSCOPY   Ambulatory referral to Pulmonology   Bronchoscopy will be on 11/08/2021  Return in about 2 weeks (around 11/14/2021) for with Kandice Robinsons, NP.    Please do your part to reduce the spread of COVID-19.

## 2021-10-31 NOTE — H&P (View-Only) (Signed)
Synopsis: Referred in April for pulmonary nodules by Sarah Chimera, MD  Subjective:   PATIENT ID: Sarah Gibbs GENDER: female DOB: 10-01-1940, MRN: [id number]  Chief Complaint  Patient presents with   Follow-up    CT review     This is an 81 year old female, past medical history of Sjogren syndrome, arthritis.  Is a former smoker quit in the early 90s however she smoked for nearly 40 years.  From respiratory standpoint she is able to complete all of her activities of daily living.  She had a CT scan of the chest in January and a follow-up PET scan completed by Dr. Sherene Gibbs.  This revealed a 2.2 cm right lower lobe hypermetabolic low-level uptake SUV of 3 within the right lower lobe as well as a groundglass opacity within the left.  Talked about the probability of a lesion like this being malignant.  And what neck steps were available.  Of note she does have plans for travel in early June to visit her granddaughter who is graduating college in [place].  OV 10/31/2021: Here today for follow-up of lung nodules.Patient had repeat CT chest on 10/05/2021: Unchanged spiculated nodular opacity within the right lower lobe 2.5 x 2 cm as well as an unchanged groundglass opacity in the left upper lobe 1.5 x 1.4 cm both concerning for indolent neoplasm.    Past Medical History:  Diagnosis Date   Sjogren's syndrome with keratoconjunctivitis sicca (HCC)      Family History  Problem Relation Age of Onset   Colon cancer Mother    Colon cancer Sister    Rheum arthritis Brother    Colon cancer Brother      Past Surgical History:  Procedure Laterality Date   EYE SURGERY      Social History   Socioeconomic History   Marital status: Married    Spouse name: Not on file   Number of children: Not on file   Years of education: Not on file   Highest education level: Not on file  Occupational History   Not on file  Tobacco Use   Smoking status: Former    Types: Cigarettes   Smokeless tobacco:  Never  Substance and Sexual Activity   Alcohol use: Never   Drug use: Never   Sexual activity: Not on file  Other Topics Concern   Not on file  Social History Narrative   Not on file   Social Determinants of Health   Financial Resource Strain: Not on file  Food Insecurity: Not on file  Transportation Needs: Not on file  Physical Activity: Not on file  Stress: Not on file  Social Connections: Not on file  Intimate Partner Violence: Not on file     Not on File   No outpatient medications prior to visit.   No facility-administered medications prior to visit.    Review of Systems  Constitutional:  Negative for chills, fever, malaise/fatigue and weight loss.  HENT:  Negative for hearing loss, sore throat and tinnitus.   Eyes:  Negative for blurred vision and double vision.  Respiratory:  Negative for cough, hemoptysis, sputum production, shortness of breath, wheezing and stridor.   Cardiovascular:  Negative for chest pain, palpitations, orthopnea, leg swelling and PND.  Gastrointestinal:  Negative for abdominal pain, constipation, diarrhea, heartburn, nausea and vomiting.  Genitourinary:  Negative for dysuria, hematuria and urgency.  Musculoskeletal:  Negative for joint pain and myalgias.  Skin:  Negative for itching and rash.  Neurological:  Negative for  dizziness, tingling, weakness and headaches.  Endo/Heme/Allergies:  Negative for environmental allergies. Does not bruise/bleed easily.  Psychiatric/Behavioral:  Negative for depression. The patient is not nervous/anxious and does not have insomnia.   All other systems reviewed and are negative.    Objective:  Physical Exam Vitals reviewed.  Constitutional:      General: She is not in acute distress.    Appearance: She is well-developed.  HENT:     Head: Normocephalic and atraumatic.  Eyes:     General: No scleral icterus.    Conjunctiva/sclera: Conjunctivae normal.     Pupils: Pupils are equal, round, and reactive  to light.  Neck:     Vascular: No JVD.     Trachea: No tracheal deviation.  Cardiovascular:     Rate and Rhythm: Normal rate and regular rhythm.     Heart sounds: Normal heart sounds. No murmur heard. Pulmonary:     Effort: Pulmonary effort is normal. No tachypnea, accessory muscle usage or respiratory distress.     Breath sounds: No stridor. No wheezing, rhonchi or rales.  Abdominal:     General: Bowel sounds are normal. There is no distension.     Palpations: Abdomen is soft.     Tenderness: There is no abdominal tenderness.  Musculoskeletal:        General: No tenderness.     Cervical back: Neck supple.  Lymphadenopathy:     Cervical: No cervical adenopathy.  Skin:    General: Skin is warm and dry.     Capillary Refill: Capillary refill takes less than 2 seconds.     Findings: No rash.  Neurological:     Mental Status: She is alert and oriented to person, place, and time.  Psychiatric:        Behavior: Behavior normal.      Vitals:   10/31/21 1120  BP: 118/68  Pulse: 67  Temp: 97.9 F (36.6 C)  TempSrc: Oral  SpO2: 99%  Weight: 123 lb 3.2 oz (55.9 kg)  Height: 5\' 4"  (1.626 m)   99% on RA BMI Readings from Last 3 Encounters:  10/31/21 21.15 kg/m  07/28/21 21.79 kg/m  07/04/21 21.49 kg/m   Wt Readings from Last 3 Encounters:  10/31/21 123 lb 3.2 oz (55.9 kg)  07/28/21 123 lb (55.8 kg)  07/04/21 125 lb 3.2 oz (56.8 kg)     CBC    Component Value Date/Time   WBC 5.5 07/04/2021 1009   RBC 4.91 07/04/2021 1009   HGB 15.6 07/04/2021 1009   HCT 45.4 07/04/2021 1009   PLT 291 07/04/2021 1009   MCV 93 07/04/2021 1009   MCH 31.8 07/04/2021 1009   MCHC 34.4 07/04/2021 1009   RDW 12.8 07/04/2021 1009   LYMPHSABS 1.5 07/04/2021 1009   EOSABS 0.1 07/04/2021 1009   BASOSABS 0.1 07/04/2021 1009     Chest Imaging: Nuclear medicine pet imaging March 2023: 2.2 cm right lower lobe low-level uptake nodule, SUV of 3.  Groundglass opacity within the left no  significant uptake. Possible indolent neoplasm.  July 2023 CT chest: Lung nodules persistent, groundglass on the left with no significant change in right lower lobe lesion also no significant change. Both have morphologies concerning for primary malignancy. The patient's images have been independently reviewed by me.    Pulmonary Functions Testing Results:     No data to display          FeNO:   Pathology:   Echocardiogram:   Heart Catheterization:  Assessment & Plan:     ICD-10-CM   1. Lung nodules  R91.8 Procedural/ Surgical Case Request: ROBOTIC ASSISTED NAVIGATIONAL BRONCHOSCOPY    Ambulatory referral to Pulmonology    2. Abnormal PET of right lung  R94.2     3. Former smoker  Z87.891        Discussion:  This is an 81 year old female, bilateral lung nodules, abnormal PET scan of the right lung, former smoker.  Plan: Today in the office we discussed risk benefits and alternatives of continued watchful waiting with surveillance imaging versus consideration for biopsy. The lesions of the chest likely are consistent with indolent neoplasm. I think based on her age and size of the lesion she has a moderate to high risk probability of especially the right lung lesion being a malignancy. We talked about the potential need for radiation treatments following diagnosis of cancer and what that would look like.  They were having some difficulty trying to decide whether or not they even wanted to know what was in the chest or should we just continue with following up images. Ultimately we settled on the decision for tissue biopsy. We will start with the primary lesion on the right side and then consider biopsies of the left lesion as long as the right side goes well. We are okay with this plan we talked about the risk of bleeding and pneumothorax. Orders placed for bronchoscopy on 11/08/2021. We appreciate the PCC's help with scheduling.  I spent 43 minutes dedicated to  the care of this patient on the date of this encounter to include pre-visit review of records, face-to-face time with the patient discussing conditions above, post visit ordering of testing, clinical documentation with the electronic health record, making appropriate referrals as documented, and communicating necessary findings to members of the patients care team.    Josephine Igo, DO [facility] 10/31/2021 12:26 PM

## 2021-11-01 NOTE — Telephone Encounter (Signed)
I just called pt & left another vm to call me- also left vm for dtr on dpr to have pt to call me.

## 2021-11-02 NOTE — Telephone Encounter (Signed)
Spoke to pt and gave her appt info.  

## 2021-11-03 NOTE — Progress Notes (Signed)
The patient has an OV with provider.

## 2021-11-04 ENCOUNTER — Other Ambulatory Visit: Payer: Self-pay | Admitting: Pulmonary Disease

## 2021-11-04 LAB — SARS CORONAVIRUS 2 (TAT 6-24 HRS): SARS Coronavirus 2: NEGATIVE

## 2021-11-07 ENCOUNTER — Other Ambulatory Visit: Payer: Self-pay

## 2021-11-07 ENCOUNTER — Telehealth: Payer: Self-pay | Admitting: Internal Medicine

## 2021-11-07 ENCOUNTER — Encounter (HOSPITAL_COMMUNITY): Payer: Self-pay | Admitting: Pulmonary Disease

## 2021-11-07 NOTE — Telephone Encounter (Signed)
Order has been printed, will fax.  

## 2021-11-07 NOTE — Anesthesia Preprocedure Evaluation (Signed)
Anesthesia Evaluation  Patient identified by MRN, date of birth, ID band Patient awake    Reviewed: Allergy & Precautions, NPO status , Patient's Chart, lab work & pertinent test results  Airway Mallampati: I  TM Distance: >3 FB Neck ROM: Full    Dental no notable dental hx. (+) Upper Dentures, Lower Dentures   Pulmonary former smoker,    Pulmonary exam normal breath sounds clear to auscultation       Cardiovascular Exercise Tolerance: Good Normal cardiovascular exam Rhythm:Regular Rate:Normal     Neuro/Psych    GI/Hepatic negative GI ROS, Neg liver ROS,   Endo/Other    Renal/GU Lab Results      Component                Value               Date                      CREATININE               1.04 (H)            07/04/2021                 K                        4.7                 07/04/2021                Musculoskeletal   Abdominal   Peds  Hematology Lab Results      Component                Value               Date                      WBC                      4.5                 11/08/2021                HGB                      14.6                11/08/2021                HCT                      42.8                11/08/2021                MCV                      93.7                11/08/2021                PLT                      174                 11/08/2021  Anesthesia Other Findings Sjorgens syndrome  Blind in L eye  Reproductive/Obstetrics                            Anesthesia Physical Anesthesia Plan  ASA: 2  Anesthesia Plan: General   Post-op Pain Management:    Induction: Intravenous  PONV Risk Score and Plan: Treatment may vary due to age or medical condition  Airway Management Planned: Oral ETT  Additional Equipment:   Intra-op Plan:   Post-operative Plan: Extubation in OR  Informed Consent: I have reviewed the patients History and  Physical, chart, labs and discussed the procedure including the risks, benefits and alternatives for the proposed anesthesia with the patient or authorized representative who has indicated his/her understanding and acceptance.     Dental advisory given  Plan Discussed with:   Anesthesia Plan Comments:        Anesthesia Quick Evaluation

## 2021-11-07 NOTE — Progress Notes (Signed)
Spoke with pt for pre-op call. Pt denies cardiac history, Diabetes or HTN. Pt does have Sjogren syndrome and it causes dry eyes. 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  Covid test done 11/04/21 and it's negative.   Shower instructions given to pt and she voiced understanding.

## 2021-11-08 ENCOUNTER — Ambulatory Visit (HOSPITAL_COMMUNITY): Payer: Medicare HMO

## 2021-11-08 ENCOUNTER — Encounter (HOSPITAL_COMMUNITY)
Admission: RE | Disposition: A | Discharge status: Home / Self Care | Payer: Self-pay | Source: Home / Self Care | Attending: Pulmonary Disease

## 2021-11-08 ENCOUNTER — Ambulatory Visit (HOSPITAL_COMMUNITY): Payer: Medicare HMO | Admitting: Anesthesiology

## 2021-11-08 ENCOUNTER — Encounter (HOSPITAL_COMMUNITY): Payer: Self-pay | Admitting: Pulmonary Disease

## 2021-11-08 ENCOUNTER — Ambulatory Visit (HOSPITAL_COMMUNITY)
Admission: RE | Admit: 2021-11-08 | Discharge: 2021-11-08 | Disposition: A | Discharge status: Home / Self Care | Payer: Medicare HMO | Attending: Pulmonary Disease | Admitting: Pulmonary Disease

## 2021-11-08 ENCOUNTER — Other Ambulatory Visit: Payer: Self-pay

## 2021-11-08 ENCOUNTER — Ambulatory Visit (HOSPITAL_BASED_OUTPATIENT_CLINIC_OR_DEPARTMENT_OTHER): Payer: Medicare HMO | Admitting: Anesthesiology

## 2021-11-08 DIAGNOSIS — R918 Other nonspecific abnormal finding of lung field: Secondary | ICD-10-CM | POA: Insufficient documentation

## 2021-11-08 DIAGNOSIS — I700 Atherosclerosis of aorta: Secondary | ICD-10-CM | POA: Diagnosis not present

## 2021-11-08 DIAGNOSIS — M3500 Sicca syndrome, unspecified: Secondary | ICD-10-CM | POA: Insufficient documentation

## 2021-11-08 DIAGNOSIS — Z87891 Personal history of nicotine dependence: Secondary | ICD-10-CM | POA: Insufficient documentation

## 2021-11-08 DIAGNOSIS — R911 Solitary pulmonary nodule: Secondary | ICD-10-CM

## 2021-11-08 DIAGNOSIS — J984 Other disorders of lung: Secondary | ICD-10-CM | POA: Diagnosis not present

## 2021-11-08 DIAGNOSIS — R0989 Other specified symptoms and signs involving the circulatory and respiratory systems: Secondary | ICD-10-CM | POA: Diagnosis not present

## 2021-11-08 HISTORY — DX: Blindness, one eye, unspecified eye: H54.40

## 2021-11-08 HISTORY — PX: BRONCHIAL NEEDLE ASPIRATION BIOPSY: SHX5106

## 2021-11-08 HISTORY — PX: BRONCHIAL WASHINGS: SHX5105

## 2021-11-08 HISTORY — PX: FIDUCIAL MARKER PLACEMENT: SHX6858

## 2021-11-08 HISTORY — PX: BRONCHIAL BIOPSY: SHX5109

## 2021-11-08 LAB — CBC
HCT: 42.8 % (ref 36.0–46.0)
Hemoglobin: 14.6 g/dL (ref 12.0–15.0)
MCH: 31.9 pg (ref 26.0–34.0)
MCHC: 34.1 g/dL (ref 30.0–36.0)
MCV: 93.7 fL (ref 80.0–100.0)
Platelets: 174 10*3/uL (ref 150–400)
RBC: 4.57 MIL/uL (ref 3.87–5.11)
RDW: 13.1 % (ref 11.5–15.5)
WBC: 4.5 10*3/uL (ref 4.0–10.5)
nRBC: 0 % (ref 0.0–0.2)

## 2021-11-08 SURGERY — BRONCHOSCOPY, WITH BIOPSY USING ELECTROMAGNETIC NAVIGATION
Anesthesia: General | Laterality: Bilateral

## 2021-11-08 MED ORDER — ROCURONIUM BROMIDE 10 MG/ML (PF) SYRINGE
PREFILLED_SYRINGE | INTRAVENOUS | Status: DC | PRN
  Administered 2021-11-08: 50 mg via INTRAVENOUS
  Administered 2021-11-08: 20 mg via INTRAVENOUS
  Administered 2021-11-08: 200 mg via INTRAVENOUS

## 2021-11-08 MED ORDER — LACTATED RINGERS IV SOLN: INTRAVENOUS | Status: DC

## 2021-11-08 MED ORDER — LIDOCAINE 2% (20 MG/ML) 5 ML SYRINGE
INTRAMUSCULAR | Status: DC | PRN
  Administered 2021-11-08: 80 mg via INTRAVENOUS

## 2021-11-08 MED ORDER — PHENYLEPHRINE 80 MCG/ML (10ML) SYRINGE FOR IV PUSH (FOR BLOOD PRESSURE SUPPORT)
PREFILLED_SYRINGE | INTRAVENOUS | Status: DC | PRN
  Administered 2021-11-08 (×2): 160 ug via INTRAVENOUS

## 2021-11-08 MED ORDER — ONDANSETRON HCL 4 MG/2ML IJ SOLN
INTRAMUSCULAR | Status: DC | PRN
  Administered 2021-11-08: 4 mg via INTRAVENOUS

## 2021-11-08 MED ORDER — PROPOFOL 10 MG/ML IV BOLUS
INTRAVENOUS | Status: DC | PRN
  Administered 2021-11-08: 100 mg via INTRAVENOUS

## 2021-11-08 MED ORDER — DEXAMETHASONE SODIUM PHOSPHATE 10 MG/ML IJ SOLN
INTRAMUSCULAR | Status: DC | PRN
  Administered 2021-11-08: 10 mg via INTRAVENOUS

## 2021-11-08 MED ORDER — PHENYLEPHRINE HCL-NACL 20-0.9 MG/250ML-% IV SOLN
INTRAVENOUS | Status: DC | PRN
  Administered 2021-11-08: 45 ug/min via INTRAVENOUS

## 2021-11-08 MED ORDER — FENTANYL CITRATE (PF) 250 MCG/5ML IJ SOLN
INTRAMUSCULAR | Status: DC | PRN
  Administered 2021-11-08 (×2): 50 ug via INTRAVENOUS

## 2021-11-08 MED ORDER — CHLORHEXIDINE GLUCONATE 0.12 % MT SOLN
15.0000 mL | Freq: Once | OROMUCOSAL | Status: AC
  Administered 2021-11-08: 15 mL via OROMUCOSAL
  Filled 2021-11-08 (×2): qty 15

## 2021-11-08 SURGICAL SUPPLY — 1 items: Superlock fiducial marker ×1 IMPLANT

## 2021-11-08 NOTE — Anesthesia Postprocedure Evaluation (Signed)
Anesthesia Post Note  Patient: Sarah Gibbs  Procedure(s) Performed: ROBOTIC ASSISTED NAVIGATIONAL BRONCHOSCOPY (Bilateral) BRONCHIAL BIOPSIES BRONCHIAL NEEDLE ASPIRATION BIOPSIES FIDUCIAL MARKER PLACEMENT BRONCHIAL WASHINGS     Patient location during evaluation: PACU Anesthesia Type: General Level of consciousness: awake and alert Pain management: pain level controlled Vital Signs Assessment: post-procedure vital signs reviewed and stable Respiratory status: spontaneous breathing, nonlabored ventilation, respiratory function stable and patient connected to nasal cannula oxygen Cardiovascular status: blood pressure returned to baseline and stable Postop Assessment: no apparent nausea or vomiting Anesthetic complications: no   No notable events documented.  Last Vitals:  Vitals:   11/08/21 1530 11/08/21 1540  BP: 101/73 98/65  Pulse: 70 71  Resp: 11 10  Temp:    SpO2: 91% 92%    Last Pain:  Vitals:   11/08/21 1540  TempSrc:   PainSc: 0-No pain   Pain Goal:                   Trevor Iha

## 2021-11-08 NOTE — Anesthesia Procedure Notes (Signed)
Procedure Name: Intubation Date/Time: 11/08/2021 1:18 PM  Performed by: Kyung Rudd, CRNAPre-anesthesia Checklist: Patient identified, Emergency Drugs available, Suction available and Patient being monitored Patient Re-evaluated:Patient Re-evaluated prior to induction Oxygen Delivery Method: Circle system utilized Preoxygenation: Pre-oxygenation with 100% oxygen Induction Type: IV induction Ventilation: Mask ventilation without difficulty Laryngoscope Size: Mac and 3 Grade View: Grade I Tube type: Oral Number of attempts: 1 Airway Equipment and Method: Stylet Placement Confirmation: ETT inserted through vocal cords under direct vision, positive ETCO2 and breath sounds checked- equal and bilateral Secured at: 19 cm Tube secured with: Tape Dental Injury: Teeth and Oropharynx as per pre-operative assessment

## 2021-11-08 NOTE — Op Note (Signed)
Video Bronchoscopy with Robotic Assisted Bronchoscopic Navigation   Date of Operation: 11/08/2021   Pre-op Diagnosis: Bilateral pulmonary nodules  Post-op Diagnosis: Bilateral pulmonary nodules  Surgeon: Garner Nash, DO  Assistants: None   Anesthesia: General endotracheal anesthesia  Operation: Flexible video fiberoptic bronchoscopy with robotic assistance and biopsies.  Estimated Blood Loss: Minimal  Complications: None  Indications and History: Sarah Gibbs is a 81 y.o. female with history of bilateral pulmonary nodules. The risks, benefits, complications, treatment options and expected outcomes were discussed with the patient.  The possibilities of pneumothorax, pneumonia, reaction to medication, pulmonary aspiration, perforation of a viscus, bleeding, failure to diagnose a condition and creating a complication requiring transfusion or operation were discussed with the patient who freely signed the consent.    Description of Procedure: The patient was seen in the Preoperative Area, was examined and was deemed appropriate to proceed.  The patient was taken to [facility] endoscopy room 3, identified as Sarah Gibbs and the procedure verified as Flexible Video Fiberoptic Bronchoscopy.  A Time Out was held and the above information confirmed.   Prior to the date of the procedure a high-resolution CT scan of the chest was performed. Utilizing ION software program a virtual tracheobronchial tree was generated to allow the creation of distinct navigation pathways to the patient's parenchymal abnormalities. After being taken to the operating room general anesthesia was initiated and the patient  was orally intubated. The video fiberoptic bronchoscope was introduced via the endotracheal tube and a general inspection was performed which showed normal right and left lung anatomy, aspiration of the bilateral mainstems was completed to remove any remaining secretions. Robotic catheter inserted into  patient's endotracheal tube.   Target #1 right lower lobe: The distinct navigation pathways prepared prior to this procedure were then utilized to navigate to patient's lesion identified on CT scan. The robotic catheter was secured into place and the vision probe was withdrawn.  Lesion location was approximated using fluoroscopy and three-dimensional cone beam CT imaging for peripheral targeting. Under fluoroscopic guidance transbronchial needle brushings, transbronchial needle biopsies, and transbronchial forceps biopsies were performed to be sent for cytology and pathology.  Following tissue sampling a single fiducial was placed in proximity of the lesion using a fiducial catheter wire and delivery kit  Target #2 left upper lobe: The distinct navigation pathways prepared prior to this procedure were then utilized to navigate to patient's lesion identified on CT scan. The robotic catheter was secured into place and the vision probe was withdrawn.  Lesion location was approximated using fluoroscopy and three-dimensional cone beam CT imaging for peripheral targeting. Under fluoroscopic guidance transbronchial forceps biopsies were performed to be sent for cytology and pathology. A bronchioalveolar lavage was performed in the left upper lobe and sent for micro.  At the end of the procedure a general airway inspection was performed and there was no evidence of active bleeding. The bronchoscope was removed.  The patient tolerated the procedure well. There was no significant blood loss and there were no obvious complications. A post-procedural chest x-ray is pending.  Samples Target #1: 1. Transbronchial needle brushings from RLL 2. Transbronchial Wang needle biopsies from RLL 3. Transbronchial forceps biopsies from RLL  Samples Target #2: 1. Transbronchial forceps biopsies from L[facility]  Plans:  The patient will be discharged from the PACU to home when recovered from anesthesia and after chest x-ray is  reviewed. We will review the cytology, pathology and microbiology results with the patient when they become available. Outpatient followup  will be with Garner Nash, DO.  Garner Nash, DO [facility] 11/08/2021 2:47 PM

## 2021-11-08 NOTE — Discharge Instructions (Signed)
Flexible Bronchoscopy, Care After This sheet gives you information about how to care for yourself after your test. Your doctor may also give you more specific instructions. If you have problems or questions, contact your doctor. Follow these instructions at home: Eating and drinking Do not eat or drink anything (not even water) for 2 hours after your test, or until your numbing medicine (local anesthetic) wears off. When your numbness is gone and your cough and gag reflexes have come back, you may: Eat only soft foods. Slowly drink liquids. The day after the test, go back to your normal diet. Driving Do not drive for 24 hours if you were given a medicine to help you relax (sedative). Do not drive or use heavy machinery while taking prescription pain medicine. General instructions  Take over-the-counter and prescription medicines only as told by your doctor. Return to your normal activities as told. Ask what activities are safe for you. Do not use any products that have nicotine or tobacco in them. This includes cigarettes and e-cigarettes. If you need help quitting, ask your doctor. Keep all follow-up visits as told by your doctor. This is important. It is very important if you had a tissue sample (biopsy) taken. Get help right away if: You have shortness of breath that gets worse. You get light-headed. You feel like you are going to pass out (faint). You have chest pain. You cough up: More than a little blood. More blood than before. Summary Do not eat or drink anything (not even water) for 2 hours after your test, or until your numbing medicine wears off. Do not use cigarettes. Do not use e-cigarettes. Get help right away if you have chest pain.  This information is not intended to replace advice given to you by your health care provider. Make sure you discuss any questions you have with your health care provider. Document Released: 02/05/2009 Document Revised: 03/23/2017 Document  Reviewed: 04/28/2016 Elsevier Patient Education  2020 [employer].

## 2021-11-08 NOTE — Transfer of Care (Signed)
Immediate Anesthesia Transfer of Care Note  Patient: Sarah Gibbs  Procedure(s) Performed: ROBOTIC ASSISTED NAVIGATIONAL BRONCHOSCOPY (Bilateral)  Patient Location: Endoscopy Unit  Anesthesia Type:General  Level of Consciousness: drowsy  Airway & Oxygen Therapy: Patient Spontanous Breathing and Patient connected to nasal cannula oxygen  Post-op Assessment: Report given to RN, Post -op Vital signs reviewed and stable and Patient moving all extremities X 4  Post vital signs: Reviewed and stable  Last Vitals:  Vitals Value Taken Time  BP 138/93 11/08/21 1442  Temp    Pulse 83 11/08/21 1445  Resp 14 11/08/21 1445  SpO2 97 % 11/08/21 1445  Vitals shown include unvalidated device data.  Last Pain:  Vitals:   11/08/21 1442  TempSrc:   PainSc: 0-No pain         Complications: No notable events documented.

## 2021-11-08 NOTE — Interval H&P Note (Signed)
History and Physical Interval Note:  11/08/2021 11:11 AM  Sarah Gibbs  has presented today for surgery, with the diagnosis of lung nodules.  The various methods of treatment have been discussed with the patient and family. After consideration of risks, benefits and other options for treatment, the patient has consented to  Procedure(s) with comments: ROBOTIC ASSISTED NAVIGATIONAL BRONCHOSCOPY (Bilateral) - ION w/ CIOS as a surgical intervention.  The patient's history has been reviewed, patient examined, no change in status, stable for surgery.  I have reviewed the patient's chart and labs.  Questions were answered to the patient's satisfaction.     Rachel Bo Braxston Quinter

## 2021-11-09 LAB — CYTOLOGY - NON PAP

## 2021-11-10 LAB — CULTURE, BAL-QUANTITATIVE W GRAM STAIN
Culture: NO GROWTH
Gram Stain: NONE SEEN

## 2021-11-13 LAB — ANAEROBIC CULTURE W GRAM STAIN: Gram Stain: NONE SEEN

## 2021-11-14 ENCOUNTER — Ambulatory Visit (INDEPENDENT_AMBULATORY_CARE_PROVIDER_SITE_OTHER): Payer: Medicare HMO | Admitting: Nurse Practitioner

## 2021-11-14 ENCOUNTER — Encounter: Payer: Self-pay | Admitting: Nurse Practitioner

## 2021-11-14 DIAGNOSIS — J8410 Pulmonary fibrosis, unspecified: Secondary | ICD-10-CM | POA: Diagnosis not present

## 2021-11-14 DIAGNOSIS — R918 Other nonspecific abnormal finding of lung field: Secondary | ICD-10-CM | POA: Diagnosis not present

## 2021-11-14 NOTE — Progress Notes (Signed)
@Patient  ID: Sarah Gibbs, female    DOB: June 03, 1940, 81 y.o.   MRN: [id number]  Chief Complaint  Patient presents with   Follow-up    She had a bronch on 10/31/2021 and she denies any breathing concerns.,     Referring provider: Richardean Chimera, MD  HPI: 81 year old female, former smoker followed for postinflammatory pulmonary fibrosis and lung nodule.  She is a patient of Dr. Thurston Hole and last seen by Dr. Tonia Brooms on 10/31/2021.  Past medical history significant for Sjogren's which she does not see rheumatology for and reports that it is only in her left eye.  She was initially referred to the pulmonary clinic in March 2023 for DOE with weed eating and abnormal CT.  She had a 2.5 cm right lower lobe dependent spiculated nodule, concerning for lung cancer.  Underwent PET scan with hypermetabolism to this area.  She also had some other nodules that had low uptake.  She opted for 3 months noncontrasted CT imaging, which was completed in June and showed unchanged spiculated nodular opacity in the right lower lobe and an unchanged groundglass opacity in the left upper lobe.  She was advised on bronchoscopy versus alternatives of continued watchful waiting with surveillance imaging.  She opted for bronchoscopy which was completed on 11/08/2021.   TEST/EVENTS:  05/09/2021 CT chest without contrast: Atherosclerotic changes.  No LAD is present.  There is mild centrilobular emphysema.  There is bibasilar predominant pattern of irregular peripheral interstitial opacity and septal thickening without evidence of associated subpleural bronchiolectasis or honeycombing, indeterminate for UIP.  There is a spiculated appearing nodular opacity in the right lower lobe measuring 2.5 x 2 cm.  There is also an additional groundglass opacity of the peripheral left upper lobe measuring 1.5 x 1.3 cm.  There is an additional nodule in the left upper lobe measuring 0.6 cm. 07/21/2021 PET scan: Hypermetabolism to the right lower  lobe nodule with SUV max of 3.2.  The 1.3 cm groundglass density nodule in the left upper lobe has SUV max of 1; possibly benign versus low-grade adenocarcinoma.  The other left upper lobe nodule is indeterminate due to small size.  No other evidence of metastatic disease. 10/05/2021 super D chest: Atherosclerosis.  Unchanged mildly prominent mediastinal and hilar lymph nodes.  There is mild centrilobular emphysema which is unchanged.  The previously mentioned nodules are unchanged in size.  There are also some occasional benign calcified pulmonary nodules which are unchanged.  Superimposed bland, bandlike scarring of the bilateral lung bases.  11/14/2021: Today - follow up Patient presents today with her sister to discuss bronchoscopy results. Cytology was negative for malignant cells and cultures are no growth to date. She feels like she recovered well since her procedure. Denies any increased shortness of breath, cough, fevers, or hemoptysis. Regarding her breathing, she doesn't notice much of an impact on her day to day activities. She feels like her breathing used to be worse than it is now. She can go shopping and do everything she wants to without any issues. No concerns or complaints today.   No Known Allergies  Immunization History  Administered Date(s) Administered   Influenza, High Dose Seasonal PF 02/21/2021   Influenza,inj,Quad PF,6+ Mos 02/01/2021   Pneumococcal Conjugate-13 10/20/2016   Tdap 07/26/2018    Past Medical History:  Diagnosis Date   Blind left eye    Sjogren's syndrome with keratoconjunctivitis sicca (HCC)     Tobacco History: Social History   Tobacco Use  Smoking  Status Former   Types: Cigarettes   Quit date: 1991   Years since quitting: 32.5  Smokeless Tobacco Never   Counseling given: Not Answered   Outpatient Medications Prior to Visit  Medication Sig Dispense Refill   Carboxymethylcellulose Sodium (THERATEARS OP) Place 1 drop into both eyes daily as  needed (Dry eyes).     No facility-administered medications prior to visit.     Review of Systems:   Constitutional: No weight loss or gain, night sweats, fevers, chills, fatigue, or lassitude. HEENT: No headaches, difficulty swallowing, tooth/dental problems, or sore throat. No sneezing, itching, ear ache, nasal congestion, or post nasal drip. +dry left eye (chronic), mild dry mouth (chronic) CV:  No chest pain, orthopnea, PND, swelling in lower extremities, anasarca, dizziness, palpitations, syncope Resp: +shortness of breath only with very strenuous activity. No excess mucus or change in color of mucus. No productive or non-productive. No hemoptysis. No wheezing.  No chest wall deformity Skin: No rash, lesions, ulcerations MSK:  No joint pain or swelling.  No decreased range of motion.  No back pain. Neuro: No dizziness or lightheadedness.  Psych: No depression or anxiety. Mood stable.     Physical Exam:  BP 116/70 (BP Location: Left Arm, Cuff Size: Normal)   Pulse 78   Temp 98 F (36.7 C) (Oral)   Ht 5\' 4"  (1.626 m)   Wt 123 lb (55.8 kg)   SpO2 97%   BMI 21.11 kg/m   GEN: Pleasant, interactive, well-appearing; in no acute distress. HEENT:  Normocephalic and atraumatic. PERRLA. Sclera white. Nasal turbinates pink, moist and patent bilaterally. No rhinorrhea present. Oropharynx pink and moist, without exudate or edema. No lesions, ulcerations, or postnasal drip.  NECK:  Supple w/ fair ROM. No JVD present. Normal carotid impulses w/o bruits. Thyroid symmetrical with no goiter or nodules palpated. No lymphadenopathy.   CV: RRR, no m/r/g, no peripheral edema. Pulses intact, +2 bilaterally. No cyanosis, pallor or clubbing. PULMONARY:  Unlabored, regular breathing. Clear bilaterally A&P w/o wheezes/rales/rhonchi. No accessory muscle use. No dullness to percussion. GI: BS present and normoactive. Soft, non-tender to palpation. No organomegaly or masses detected. No CVA  tenderness. MSK: No erythema, warmth or tenderness. Cap refil <2 sec all extrem. No deformities or joint swelling noted.  Neuro: A/Ox3. No focal deficits noted.   Skin: Warm, no lesions or rashe Psych: Normal affect and behavior. Judgement and thought content appropriate.     Lab Results:  CBC    Component Value Date/Time   WBC 4.5 11/08/2021 1100   RBC 4.57 11/08/2021 1100   HGB 14.6 11/08/2021 1100   HGB 15.6 07/04/2021 1009   HCT 42.8 11/08/2021 1100   HCT 45.4 07/04/2021 1009   PLT 174 11/08/2021 1100   PLT 291 07/04/2021 1009   MCV 93.7 11/08/2021 1100   MCV 93 07/04/2021 1009   MCH 31.9 11/08/2021 1100   MCHC 34.1 11/08/2021 1100   RDW 13.1 11/08/2021 1100   RDW 12.8 07/04/2021 1009   LYMPHSABS 1.5 07/04/2021 1009   EOSABS 0.1 07/04/2021 1009   BASOSABS 0.1 07/04/2021 1009    BMET    Component Value Date/Time   NA 135 07/04/2021 1009   K 4.7 07/04/2021 1009   CL 102 07/04/2021 1009   CO2 24 07/04/2021 1009   GLUCOSE 90 07/04/2021 1009   BUN 10 07/04/2021 1009   CREATININE 1.04 (H) 07/04/2021 1009   CALCIUM 9.4 07/04/2021 1009    BNP No results found for: "BNP"  Imaging:  DG CHEST PORT 1 VIEW  Result Date: 11/08/2021 CLINICAL DATA:  81 year old female status post bronchoscopy EXAM: PORTABLE CHEST 1 VIEW COMPARISON:  CT chest 10/05/2021 FINDINGS: The heart size and mediastinal contours are within normal limits. Mild pulmonary vascular congestion. Biapical scarring. No pleural effusion or pneumothorax. Bibasilar pulmonary infiltrates and/or edema. Solid and ground-glass nodules seen on CT 10/05/2021 are not well demonstrated radiographically. Small 2 mm linear metallic density projects over right lower lobe and may represent a fiducial marker corresponding to biopsy site. Aortic calcification. IMPRESSION: Bibasilar pulmonary infiltrates and/or edema. No pneumothorax or pleural effusion. Electronically Signed   By: Minerva Fester M.D.   On: 11/08/2021 15:19    DG C-Arm 1-60 Min-No Report  Result Date: 11/08/2021 Fluoroscopy was utilized by the requesting physician.  No radiographic interpretation.   DG C-ARM BRONCHOSCOPY  Result Date: 11/08/2021 C-ARM BRONCHOSCOPY: Fluoroscopy was utilized by the requesting physician.  No radiographic interpretation.          No data to display          No results found for: "NITRICOXIDE"      Assessment & Plan:   Lung nodules Recovered well post bronchoscopy performed 7/18.  Cytology was negative for malignancy.  She did have some inflammatory cells.  Lung nodules possibly related to underlying autoimmune disease.  Cultures no growth to date.  Still awaiting final fungal and AFB.  Reviewed results with patient and her sister.  We will plan for 63-month follow-up CT to ensure stability.    Patient Instructions  Repeat CT chest in September/October for 3 month follow up. Someone will contact you for scheduling   Follow up after CT scan with Dr. Tonia Brooms. If symptoms do not improve or worsen, please contact office for sooner follow up or seek emergency care.    Postinflammatory pulmonary fibrosis (HCC) Previous CTs with fibrotic changes, indeterminate for UIP.  She feels as though her breathing is overall improved when compared to earlier this year and has no respiratory concerns or complaints today.  We will continue to monitor her symptoms and imaging moving forward.   I spent 25 minutes of dedicated to the care of this patient on the date of this encounter to include pre-visit review of records, face-to-face time with the patient discussing conditions above, post visit ordering of testing, clinical documentation with the electronic health record, making appropriate referrals as documented, and communicating necessary findings to members of the patients care team.  Noemi Chapel, NP 11/14/2021  Pt aware and understands NP's role.

## 2021-11-14 NOTE — Assessment & Plan Note (Signed)
Recovered well post bronchoscopy performed 7/18.  Cytology was negative for malignancy.  She did have some inflammatory cells.  Lung nodules possibly related to underlying autoimmune disease.  Cultures no growth to date.  Still awaiting final fungal and AFB.  Reviewed results with patient and her sister.  We will plan for 55-month follow-up CT to ensure stability.    Patient Instructions  Repeat CT chest in September/October for 3 month follow up. Someone will contact you for scheduling   Follow up after CT scan with Dr. Tonia Brooms. If symptoms do not improve or worsen, please contact office for sooner follow up or seek emergency care.

## 2021-11-14 NOTE — Patient Instructions (Signed)
Repeat CT chest in September/October for 3 month follow up. Someone will contact you for scheduling   Follow up after CT scan with Dr. Tonia Brooms. If symptoms do not improve or worsen, please contact office for sooner follow up or seek emergency care.

## 2021-11-14 NOTE — Assessment & Plan Note (Signed)
Previous CTs with fibrotic changes, indeterminate for UIP.  She feels as though her breathing is overall improved when compared to earlier this year and has no respiratory concerns or complaints today.  We will continue to monitor her symptoms and imaging moving forward.

## 2021-11-15 LAB — ACID FAST SMEAR (AFB, MYCOBACTERIA): Acid Fast Smear: NEGATIVE

## 2021-12-16 LAB — FUNGAL ORGANISM REFLEX

## 2021-12-16 LAB — FUNGUS CULTURE RESULT

## 2021-12-16 LAB — FUNGUS CULTURE WITH STAIN

## 2021-12-28 LAB — ACID FAST CULTURE WITH REFLEXED SENSITIVITIES (MYCOBACTERIA): Acid Fast Culture: NEGATIVE

## 2022-04-10 DIAGNOSIS — R030 Elevated blood-pressure reading, without diagnosis of hypertension: Secondary | ICD-10-CM | POA: Diagnosis not present

## 2022-04-10 DIAGNOSIS — R1031 Right lower quadrant pain: Secondary | ICD-10-CM | POA: Diagnosis not present

## 2022-09-14 DIAGNOSIS — R0781 Pleurodynia: Secondary | ICD-10-CM | POA: Diagnosis not present

## 2022-09-14 DIAGNOSIS — J189 Pneumonia, unspecified organism: Secondary | ICD-10-CM | POA: Diagnosis not present

## 2022-09-14 DIAGNOSIS — Z87891 Personal history of nicotine dependence: Secondary | ICD-10-CM | POA: Diagnosis not present

## 2022-09-14 DIAGNOSIS — R918 Other nonspecific abnormal finding of lung field: Secondary | ICD-10-CM | POA: Diagnosis not present

## 2022-09-14 DIAGNOSIS — Z7722 Contact with and (suspected) exposure to environmental tobacco smoke (acute) (chronic): Secondary | ICD-10-CM | POA: Diagnosis not present

## 2022-09-14 DIAGNOSIS — R059 Cough, unspecified: Secondary | ICD-10-CM | POA: Diagnosis not present

## 2022-09-14 DIAGNOSIS — J439 Emphysema, unspecified: Secondary | ICD-10-CM | POA: Diagnosis not present

## 2022-09-25 DIAGNOSIS — R030 Elevated blood-pressure reading, without diagnosis of hypertension: Secondary | ICD-10-CM | POA: Diagnosis not present

## 2022-09-25 DIAGNOSIS — Z6822 Body mass index (BMI) 22.0-22.9, adult: Secondary | ICD-10-CM | POA: Diagnosis not present

## 2022-09-25 DIAGNOSIS — J189 Pneumonia, unspecified organism: Secondary | ICD-10-CM | POA: Diagnosis not present

## 2022-10-09 DIAGNOSIS — J189 Pneumonia, unspecified organism: Secondary | ICD-10-CM | POA: Diagnosis not present

## 2022-12-19 IMAGING — CT CT CHEST SUPER D W/O CM
2 of 5 series · 14 of 36 positions shown, 17 images · non-contrast
Comparison: PET-CT, 07/21/2021, CT chest, 05/09/2021

CLINICAL DATA: Follow-up PET avid lung nodules suspicious for
malignancy, shortness of breath * Tracking Code: BO *

EXAM:
CT CHEST WITHOUT CONTRAST
TECHNIQUE: Multidetector CT imaging of the chest was performed using thin slice
collimation for electromagnetic bronchoscopy planning purposes,
without intravenous contrast.
RADIATION DOSE REDUCTION: This exam was performed according to the
departmental dose-optimization program which includes automated
exposure control, adjustment of the mA and/or kV according to
patient size and/or use of iterative reconstruction technique.

[Series 3: super d-thins · axial · 0.58mm/px · z∈[+1236,+1535]mm · 11 of 432 slices shown, 14 images]
[im 29/432  mediastinal]
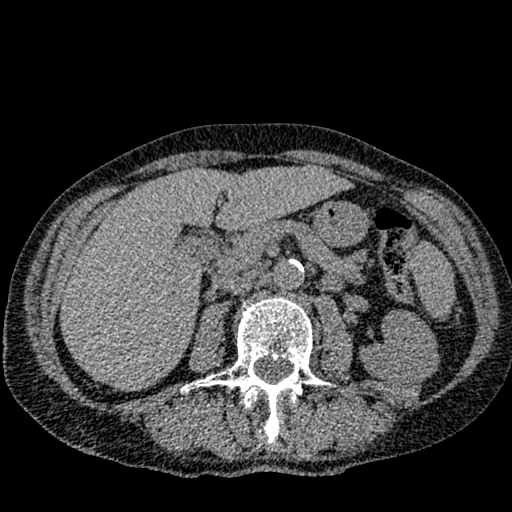
[im 29/432  lung]
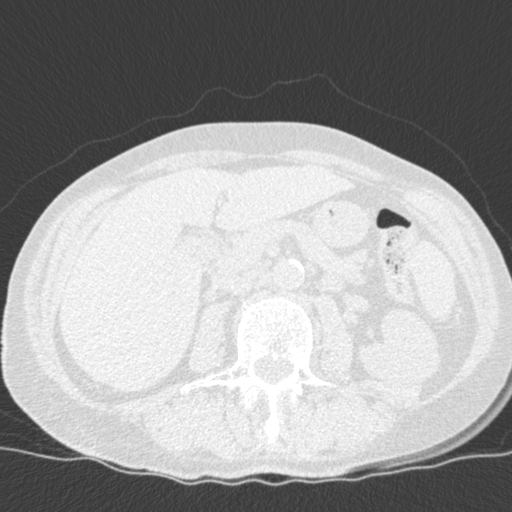
[im 58/432  lung]
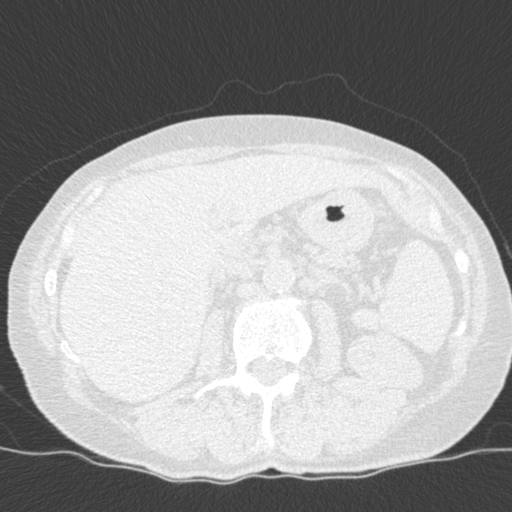
[im 115/432  lung]
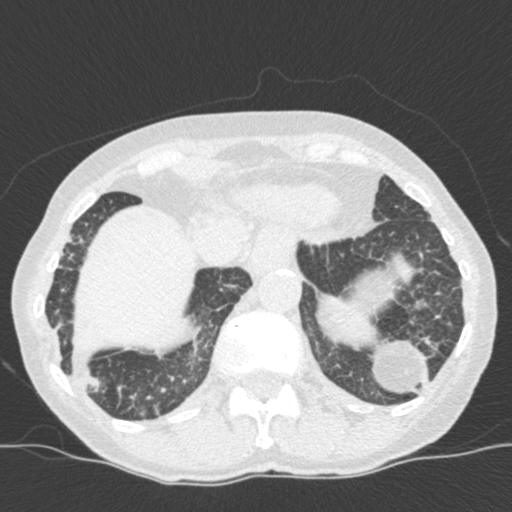
[im 144/432  lung]
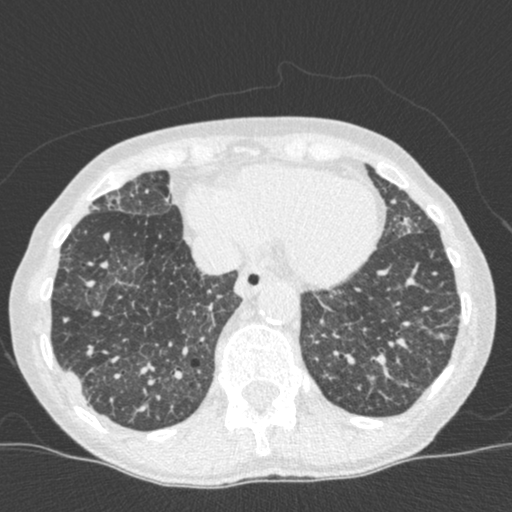
[im 173/432  mediastinal]
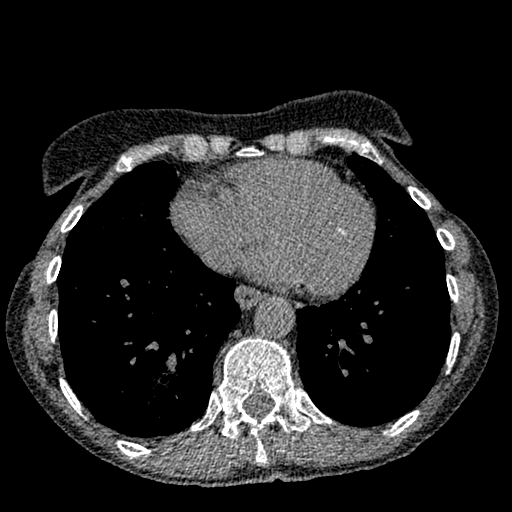
[im 173/432  lung]
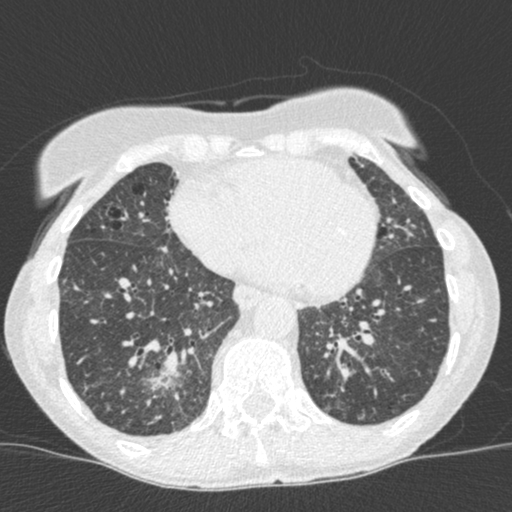
[im 230/432  lung]
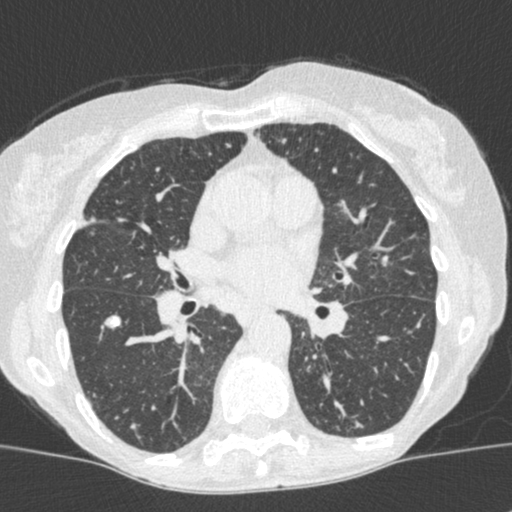
[im 259/432  lung]
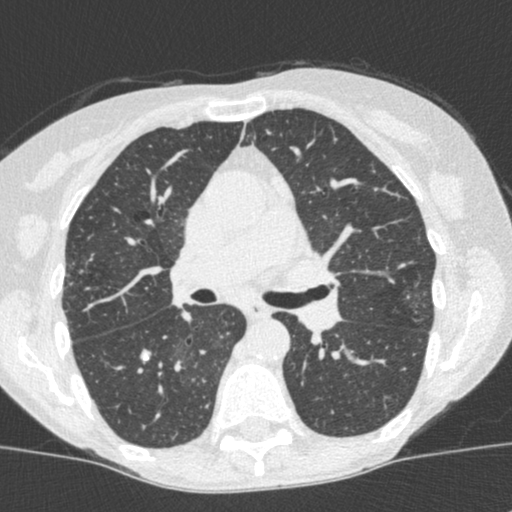
[im 288/432  lung]
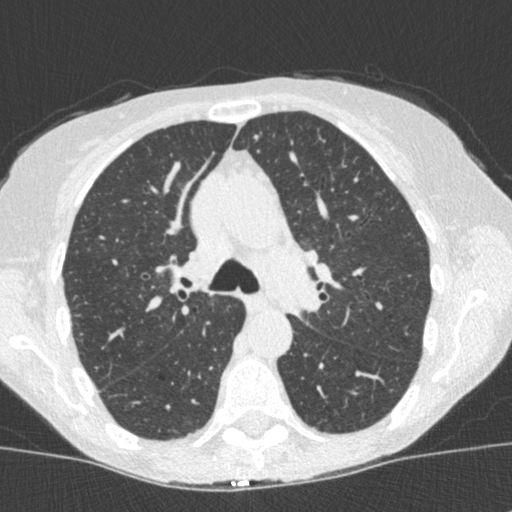
[im 317/432  mediastinal]
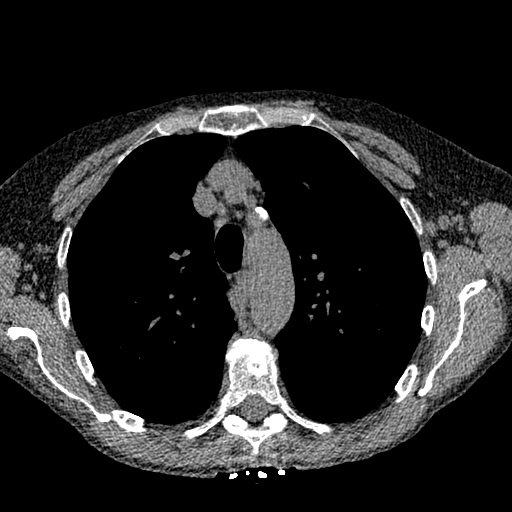
[im 317/432  lung]
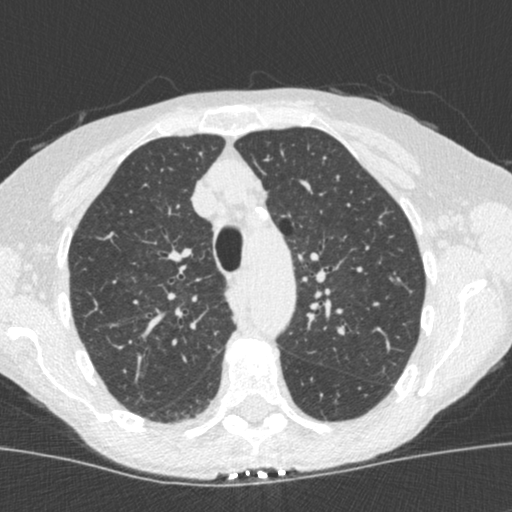
[im 374/432  lung]
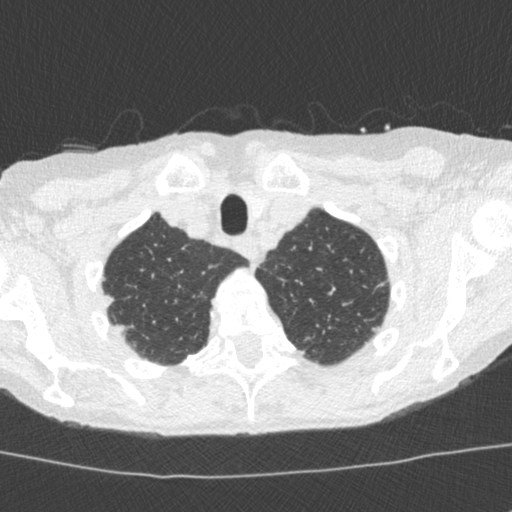
[im 403/432  lung]
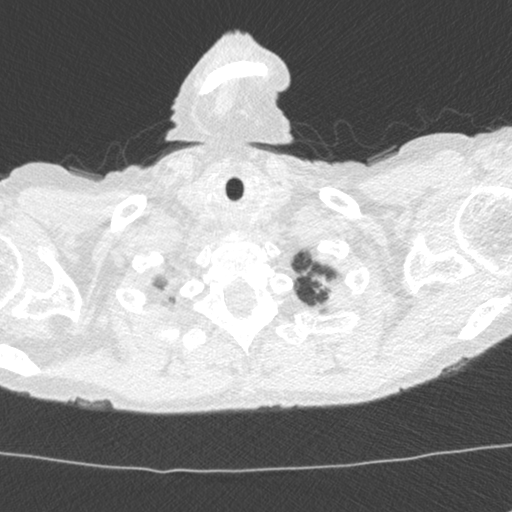

[Series 5: coronal · coronal · 0.63mm/px · 3 of 150 slices shown]
[im 30/150  lung]
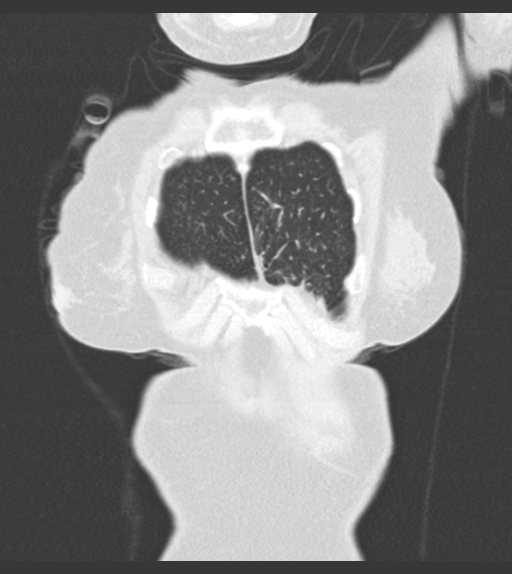
[im 60/150  lung]
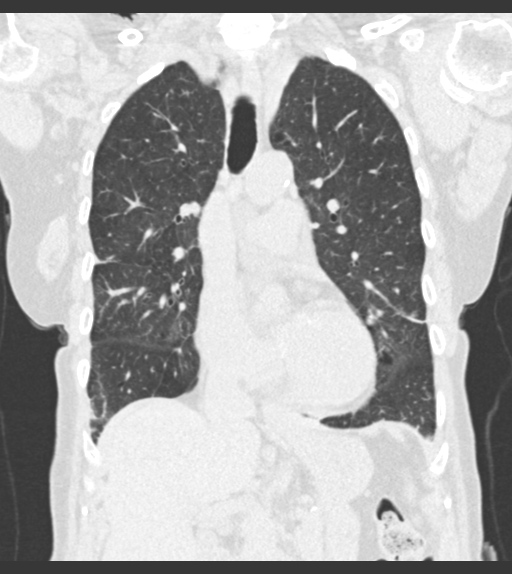
[im 90/150  lung]
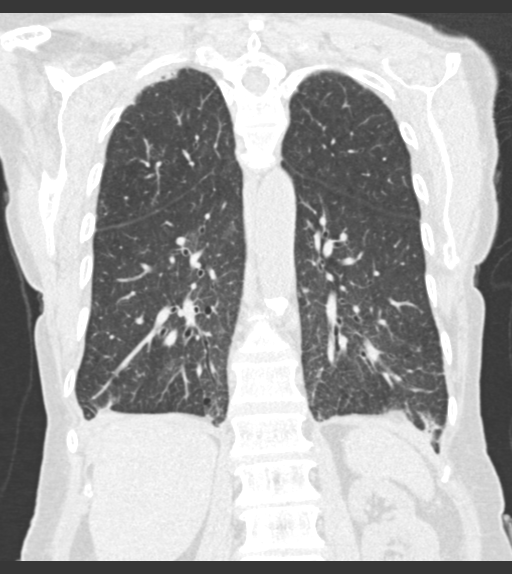

[14 of 36 positions shown; findings below may reference images not displayed]

FINDINGS: Cardiovascular: Aortic atherosclerosis. Normal heart size. Left
coronary artery calcifications. No pericardial effusion.

Mediastinum/Nodes: Unchanged mildly prominent mediastinal and hilar
lymph nodes. Thyroid gland, trachea, and esophagus demonstrate no
significant findings.

Lungs/Pleura: Mild centrilobular emphysema. Unchanged spiculated
nodular opacity of the dependent right lower lobe measuring 2.5 x
2.0 cm (series 4, image 108). Unchanged ground-glass opacity of the
peripheral left upper lobe measuring 1.5 x 1.4 cm (series 4, image
68). Unchanged 0.6 cm ground-glass nodule of the anterior left upper
lobe (series 4, image 73). Occasional benign, calcified pulmonary
nodules, unchanged. Background of very fine centrilobular nodules,
most concentrated in the lung apices, unchanged. Unchanged
background parenchymal appearance of the lungs, with irregular
interstitial opacity and septal thickening most concentrated in the
lung bases, however without evidence of subpleural bronchiolectasis
or honeycombing. Superimposed bland, bandlike scarring of the
bilateral lung bases (series 4, image 123).

Upper Abdomen: No acute abnormality.

Musculoskeletal: No chest wall abnormality. No suspicious osseous
lesions identified.
IMPRESSION: 1. Unchanged spiculated nodular opacity of the dependent right lower
lobe measuring 2.5 x 2.0 cm, previously with demonstrable FDG
avidity and despite interval stability remains concerning for
malignancy.
2. Unchanged ground-glass opacity of the peripheral left upper lobe
measuring 1.5 x 1.4 cm as above previously with demonstrable FDG
avidity and worrisome for indolent adenocarcinoma.
3. Unchanged 0.6 cm ground-glass nodule of the anterior left upper
lobe, more nonspecific and too small to characterize by prior
PET-CT. Attention on follow-up.
4. Unchanged background parenchymal appearance of the lungs, with
irregular interstitial opacity and septal thickening most
concentrated in the lung bases, however without evidence of
subpleural bronchiolectasis or honeycombing. As previously reported,
Findings are indeterminate for UIP per consensus guidelines but
possibly reflective of chronic sequelae of prior infection or
aspiration: Diagnosis of Idiopathic Pulmonary Fibrosis: An Official
ATS/ERS/JRS/ALAT Clinical Practice Guideline. Am J Respir Crit Care
Med Vol 198, Franqui 5, [id number]].
5. Unchanged prominent mediastinal lymph nodes, not previously FDG
avid and most likely benign and reactive.
6. Emphysema and background of very fine centrilobular nodules, most
concentrated in the lung apices, unchanged, most commonly seen in
smoking-related respiratory bronchiolitis.
7. Coronary artery disease.

Aortic Atherosclerosis ([id number]) and Emphysema ([id number].[id number]).

## 2022-12-21 DIAGNOSIS — R030 Elevated blood-pressure reading, without diagnosis of hypertension: Secondary | ICD-10-CM | POA: Diagnosis not present

## 2022-12-21 DIAGNOSIS — N189 Chronic kidney disease, unspecified: Secondary | ICD-10-CM | POA: Diagnosis not present

## 2022-12-21 DIAGNOSIS — R911 Solitary pulmonary nodule: Secondary | ICD-10-CM | POA: Diagnosis not present

## 2022-12-21 DIAGNOSIS — J189 Pneumonia, unspecified organism: Secondary | ICD-10-CM | POA: Diagnosis not present

## 2022-12-21 DIAGNOSIS — Z6822 Body mass index (BMI) 22.0-22.9, adult: Secondary | ICD-10-CM | POA: Diagnosis not present

## 2023-01-04 DIAGNOSIS — L03115 Cellulitis of right lower limb: Secondary | ICD-10-CM | POA: Diagnosis not present

## 2023-08-04 DIAGNOSIS — S81851A Open bite, right lower leg, initial encounter: Secondary | ICD-10-CM | POA: Diagnosis not present

## 2023-08-04 DIAGNOSIS — Z87891 Personal history of nicotine dependence: Secondary | ICD-10-CM | POA: Diagnosis not present

## 2023-08-04 DIAGNOSIS — S81852A Open bite, left lower leg, initial encounter: Secondary | ICD-10-CM | POA: Diagnosis not present

## 2023-08-04 DIAGNOSIS — Z7722 Contact with and (suspected) exposure to environmental tobacco smoke (acute) (chronic): Secondary | ICD-10-CM | POA: Diagnosis not present

## 2023-08-04 DIAGNOSIS — Z23 Encounter for immunization: Secondary | ICD-10-CM | POA: Diagnosis not present

## 2023-08-04 DIAGNOSIS — S81832A Puncture wound without foreign body, left lower leg, initial encounter: Secondary | ICD-10-CM | POA: Diagnosis not present

## 2023-08-04 DIAGNOSIS — W540XXA Bitten by dog, initial encounter: Secondary | ICD-10-CM | POA: Diagnosis not present

## 2023-08-07 DIAGNOSIS — T148XXA Other injury of unspecified body region, initial encounter: Secondary | ICD-10-CM | POA: Diagnosis not present

## 2023-08-07 DIAGNOSIS — W540XXA Bitten by dog, initial encounter: Secondary | ICD-10-CM | POA: Diagnosis not present

## 2023-08-07 DIAGNOSIS — Z6822 Body mass index (BMI) 22.0-22.9, adult: Secondary | ICD-10-CM | POA: Diagnosis not present

## 2023-08-28 DIAGNOSIS — L0390 Cellulitis, unspecified: Secondary | ICD-10-CM | POA: Diagnosis not present

## 2023-08-28 DIAGNOSIS — W540XXS Bitten by dog, sequela: Secondary | ICD-10-CM | POA: Diagnosis not present

## 2023-08-28 DIAGNOSIS — Z6822 Body mass index (BMI) 22.0-22.9, adult: Secondary | ICD-10-CM | POA: Diagnosis not present

## 2023-09-13 DIAGNOSIS — Z6841 Body Mass Index (BMI) 40.0 and over, adult: Secondary | ICD-10-CM | POA: Diagnosis not present

## 2023-09-13 DIAGNOSIS — W540XXS Bitten by dog, sequela: Secondary | ICD-10-CM | POA: Diagnosis not present

## 2023-09-13 DIAGNOSIS — L0390 Cellulitis, unspecified: Secondary | ICD-10-CM | POA: Diagnosis not present

## 2023-09-27 DIAGNOSIS — Z789 Other specified health status: Secondary | ICD-10-CM | POA: Diagnosis not present

## 2023-10-17 DIAGNOSIS — L0390 Cellulitis, unspecified: Secondary | ICD-10-CM | POA: Diagnosis not present

## 2023-10-17 DIAGNOSIS — W540XXS Bitten by dog, sequela: Secondary | ICD-10-CM | POA: Diagnosis not present

## 2023-10-17 DIAGNOSIS — J209 Acute bronchitis, unspecified: Secondary | ICD-10-CM | POA: Diagnosis not present

## 2023-10-17 DIAGNOSIS — Z6822 Body mass index (BMI) 22.0-22.9, adult: Secondary | ICD-10-CM | POA: Diagnosis not present
